# Patient Record
Sex: Female | Born: 2000 | Race: White | Hispanic: No | Marital: Single | Smoking: Former smoker
Health system: Southern US, Community
[De-identification: ages and names within clinical notes are randomized; demographics above are authoritative.]

## PROBLEM LIST (undated history)

## (undated) DIAGNOSIS — O139 Gestational [pregnancy-induced] hypertension without significant proteinuria, unspecified trimester: Secondary | ICD-10-CM

## (undated) DIAGNOSIS — Z789 Other specified health status: Secondary | ICD-10-CM

## (undated) HISTORY — PX: NO PAST SURGERIES: SHX2092

## (undated) HISTORY — DX: Other specified health status: Z78.9

## (undated) HISTORY — DX: Gestational (pregnancy-induced) hypertension without significant proteinuria, unspecified trimester: O13.9

---
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
## 2020-01-11 ENCOUNTER — Encounter: Payer: Self-pay | Admitting: Orthopaedic Surgery

## 2020-01-11 ENCOUNTER — Ambulatory Visit: Payer: Medicaid Other

## 2020-01-11 ENCOUNTER — Ambulatory Visit (INDEPENDENT_AMBULATORY_CARE_PROVIDER_SITE_OTHER): Payer: Medicaid Other | Admitting: Orthopaedic Surgery

## 2020-01-11 ENCOUNTER — Other Ambulatory Visit: Payer: Self-pay

## 2020-01-11 DIAGNOSIS — S62336D Displaced fracture of neck of fifth metacarpal bone, right hand, subsequent encounter for fracture with routine healing: Secondary | ICD-10-CM

## 2020-01-11 DIAGNOSIS — F17210 Nicotine dependence, cigarettes, uncomplicated: Secondary | ICD-10-CM

## 2020-01-11 DIAGNOSIS — S62316D Displaced fracture of base of fifth metacarpal bone, right hand, subsequent encounter for fracture with routine healing: Secondary | ICD-10-CM

## 2020-01-11 MED ORDER — HYDROCODONE-ACETAMINOPHEN 5-325 MG PO TABS: ORAL_TABLET | ORAL | 0 refills | Status: DC

## 2020-01-11 NOTE — Patient Instructions (Signed)
Steps to Quit Smoking Smoking tobacco is the leading cause of preventable death. It can affect almost every organ in the body. Smoking puts you and people around you at risk for many serious, long-lasting (chronic) diseases. Quitting smoking can be hard, but it is one of the best things that you can do for your health. It is never too late to quit. How do I get ready to quit? When you decide to quit smoking, make a plan to help you succeed. Before you quit:  Pick a date to quit. Set a date within the next 2 weeks to give you time to prepare.  Write down the reasons why you are quitting. Keep this list in places where you will see it often.  Tell your family, friends, and co-workers that you are quitting. Their support is important.  Talk with your doctor about the choices that may help you quit.  Find out if your health insurance will pay for these treatments.  Know the people, places, things, and activities that make you want to smoke (triggers). Avoid them. What first steps can I take to quit smoking?  Throw away all cigarettes at home, at work, and in your car.  Throw away the things that you use when you smoke, such as ashtrays and lighters.  Clean your car. Make sure to empty the ashtray.  Clean your home, including curtains and carpets. What can I do to help me quit smoking? Talk with your doctor about taking medicines and seeing a counselor at the same time. You are more likely to succeed when you do both.  If you are pregnant or breastfeeding, talk with your doctor about counseling or other ways to quit smoking. Do not take medicine to help you quit smoking unless your doctor tells you to do so. To quit smoking: Quit right away  Quit smoking totally, instead of slowly cutting back on how much you smoke over a period of time.  Go to counseling. You are more likely to quit if you go to counseling sessions regularly. Take medicine You may take medicines to help you quit. Some  medicines need a prescription, and some you can buy over-the-counter. Some medicines may contain a drug called nicotine to replace the nicotine in cigarettes. Medicines may:  Help you to stop having the desire to smoke (cravings).  Help to stop the problems that come when you stop smoking (withdrawal symptoms). Your doctor may ask you to use:  Nicotine patches, gum, or lozenges.  Nicotine inhalers or sprays.  Non-nicotine medicine that is taken by mouth. Find resources Find resources and other ways to help you quit smoking and remain smoke-free after you quit. These resources are most helpful when you use them often. They include:  Online chats with a counselor.  Phone quitlines.  Printed self-help materials.  Support groups or group counseling.  Text messaging programs.  Mobile phone apps. Use apps on your mobile phone or tablet that can help you stick to your quit plan. There are many free apps for mobile phones and tablets as well as websites. Examples include Quit Guide from the CDC and smokefree.gov  What things can I do to make it easier to quit?   Talk to your family and friends. Ask them to support and encourage you.  Call a phone quitline (1-800-QUIT-NOW), reach out to support groups, or work with a counselor.  Ask people who smoke to not smoke around you.  Avoid places that make you want to smoke,   such as: ? Bars. ? Parties. ? Smoke-break areas at work.  Spend time with people who do not smoke.  Lower the stress in your life. Stress can make you want to smoke. Try these things to help your stress: ? Getting regular exercise. ? Doing deep-breathing exercises. ? Doing yoga. ? Meditating. ? Doing a body scan. To do this, close your eyes, focus on one area of your body at a time from head to toe. Notice which parts of your body are tense. Try to relax the muscles in those areas. How will I feel when I quit smoking? Day 1 to 3 weeks Within the first 24 hours,  you may start to have some problems that come from quitting tobacco. These problems are very bad 2-3 days after you quit, but they do not often last for more than 2-3 weeks. You may get these symptoms:  Mood swings.  Feeling restless, nervous, angry, or annoyed.  Trouble concentrating.  Dizziness.  Strong desire for high-sugar foods and nicotine.  Weight gain.  Trouble pooping (constipation).  Feeling like you may vomit (nausea).  Coughing or a sore throat.  Changes in how the medicines that you take for other issues work in your body.  Depression.  Trouble sleeping (insomnia). Week 3 and afterward After the first 2-3 weeks of quitting, you may start to notice more positive results, such as:  Better sense of smell and taste.  Less coughing and sore throat.  Slower heart rate.  Lower blood pressure.  Clearer skin.  Better breathing.  Fewer sick days. Quitting smoking can be hard. Do not give up if you fail the first time. Some people need to try a few times before they succeed. Do your best to stick to your quit plan, and talk with your doctor if you have any questions or concerns. Summary  Smoking tobacco is the leading cause of preventable death. Quitting smoking can be hard, but it is one of the best things that you can do for your health.  When you decide to quit smoking, make a plan to help you succeed.  Quit smoking right away, not slowly over a period of time.  When you start quitting, seek help from your doctor, family, or friends. This information is not intended to replace advice given to you by your health care provider. Make sure you discuss any questions you have with your health care provider. Document Revised: 06/30/2019 Document Reviewed: 12/24/2018 Elsevier Patient Education  2020 Elsevier Inc.  

## 2020-01-11 NOTE — Progress Notes (Signed)
Patient XB:LTJQZES Laura Mayo, female DOB:[birth year], 19 y.o. PQZ:[id number]  Chief Complaint  Patient presents with  . Hand Injury    right hand fracture     HPI  Laura Mayo is a 19 y.o. female who has fracture of the right fifth metacarpal.  She had x-rays today which showed displacement of the fracture.  I told her and showed her the x-rays.  I needed to do a closed reduction.  Procedure note: After permission from the patient and prep of the right lateral hand, I injected 1% xylocaine as a hematoma block at the right fifth metacarpal head by sterile technique tolerated well.  Closed reduction was then done.  A new splint applied.  New X-rays were done and reported separately.  The results were marked improvement and reduction of the fifth metacarpal fracture at the neck.   There is no height or weight on file to calculate BMI.  ROS  Review of Systems  Constitutional: Positive for activity change.  Musculoskeletal: Positive for arthralgias and joint swelling.  All other systems reviewed and are negative.   All other systems reviewed and are negative.  The following is a summary of the past history medically, past history surgically, known current medicines, social history and family history.  This information is gathered electronically by the computer from prior information and documentation.  I review this each visit and have found including this information at this point in the chart is beneficial and informative.    History reviewed. No pertinent past medical history.  History reviewed. No pertinent surgical history.  Family History  Problem Relation Age of Onset  . Cancer Paternal Grandmother        breast  . Cancer Maternal Grandmother        colon  . Hypertension Maternal Grandfather     Social History Social History   Tobacco Use  . Smoking status: Current Every Day Smoker    Packs/day: 0.50    Types: Cigarettes  . Smokeless tobacco: Never  Used  Substance Use Topics  . Alcohol use: No  . Drug use: No    No Known Allergies  Current Outpatient Medications  Medication Sig Dispense Refill  . oxyCODONE-acetaminophen (PERCOCET/ROXICET) 5-325 MG tablet Take 2 tablets by mouth every 4 (four) hours as needed for severe pain. 6 tablet 0  . HYDROcodone-acetaminophen (NORCO/VICODIN) 5-325 MG tablet One tablet every six hours for pain.  Limit 7 days. 28 tablet 0   No current facility-administered medications for this visit.     Physical Exam  There were no vitals taken for this visit.  Constitutional: overall normal hygiene, normal nutrition, well developed, normal grooming, normal body habitus. Assistive device:right hand splint  Musculoskeletal: gait and station Limp none, muscle tone and strength are normal, no tremors or atrophy is present.  .  Neurological: coordination overall normal.  Deep tendon reflex/nerve stretch intact.  Sensation normal.  Cranial nerves II-XII intact.   Skin:   Normal overall no scars, lesions, ulcers or rashes. No psoriasis.  Psychiatric: Alert and oriented x 3.  Recent memory intact, remote memory unclear.  Normal mood and affect. Well groomed.  Good eye contact.  Cardiovascular: overall no swelling, no varicosities, no edema bilaterally, normal temperatures of the legs and arms, no clubbing, cyanosis and good capillary refill.  Lymphatic: palpation is normal.  She has rotary deformity of the right little finger prior to reduction.  NV intact.  All other systems reviewed and are negative   The patient  has been educated about the nature of the problem(s) and counseled on treatment options.  The patient appeared to understand what I have discussed and is in agreement with it.  Encounter Diagnoses  Name Primary?  . Closed displaced fracture of neck of fifth metacarpal bone of right hand with routine healing, subsequent encounter Yes  . Nicotine dependence, cigarettes, uncomplicated      PLAN Call if any problems.  Precautions discussed.  Continue current medications.   Return to clinic 2 weeks   X-rays then out of splint right hand.  I have reviewed the [place] web site prior to prescribing narcotic medicine for this patient.   Electronically Signed Sanjuana Kava, MD 3/25/202110:52 AM

## 2020-01-25 ENCOUNTER — Ambulatory Visit: Payer: Medicaid Other | Admitting: Orthopaedic Surgery

## 2020-01-25 ENCOUNTER — Encounter: Payer: Self-pay | Admitting: Orthopaedic Surgery

## 2020-05-01 ENCOUNTER — Encounter: Payer: Self-pay | Admitting: Adult Health

## 2020-05-01 ENCOUNTER — Ambulatory Visit (INDEPENDENT_AMBULATORY_CARE_PROVIDER_SITE_OTHER): Payer: Medicaid Other | Admitting: Adult Health

## 2020-05-01 ENCOUNTER — Other Ambulatory Visit: Payer: Self-pay

## 2020-05-01 VITALS — BP 136/88 | HR 107 | Ht 65.0 in | Wt 139.0 lb

## 2020-05-01 DIAGNOSIS — Z3046 Encounter for surveillance of implantable subdermal contraceptive: Secondary | ICD-10-CM | POA: Diagnosis not present

## 2020-05-01 NOTE — Patient Instructions (Signed)
Use condoms x 2 weeks, keep clean and dry x 24 hours, no heavy lifting, keep steri strips on x 72 hours, Keep pressure dressing on x 24 hours. Follow up prn problems.  

## 2020-05-01 NOTE — Progress Notes (Signed)
°  Subjective:     Patient ID: Laura Mayo, female   DOB: 09/04/2001, 19 y.o.   MRN: [id number]  HPI Laura Mayo is a 19 year old white female,single, G0P0, in for nexplanon removal, wants to get pregnant. PCP is Dr Phillips Odor.  Review of Systems For nexplanon removal Reviewed past medical,surgical, social and family history. Reviewed medications and allergies.     Objective:   Physical Exam BP 136/88 (BP Location: Left Arm, Patient Position: Sitting, Cuff Size: Normal)    Pulse (!) 107    Ht 5\' 5"  (1.651 m)    Wt 139 lb (63 kg)    BMI 23.13 kg/m fall risk is low Consent signed, time out called. Left arm cleansed with betadine, and injected with 1.5 cc 2% lidocaine and waited til numb.Under sterile technique a #11 blade was used to make small vertical incision, and a curved forceps was used to easily remove rod. Steri strips applied. Pressure dressing applied.    Assessment:     1. Nexplanon removal Take OTC PNV    Stop any smoking   Plan:     Use condoms x 2 weeks, keep clean and dry x 24 hours, no heavy lifting, keep steri strips on x 72 hours, Keep pressure dressing on x 24 hours. Follow up prn problems.

## 2020-06-26 ENCOUNTER — Ambulatory Visit (INDEPENDENT_AMBULATORY_CARE_PROVIDER_SITE_OTHER): Payer: Medicaid Other | Admitting: *Deleted

## 2020-06-26 VITALS — BP 130/87 | HR 115 | Ht 65.0 in | Wt 143.0 lb

## 2020-06-26 DIAGNOSIS — Z3201 Encounter for pregnancy test, result positive: Secondary | ICD-10-CM | POA: Diagnosis not present

## 2020-06-26 LAB — POCT URINE PREGNANCY: Preg Test, Ur: POSITIVE — AB

## 2020-06-26 NOTE — Progress Notes (Signed)
   NURSE VISIT- PREGNANCY CONFIRMATION   SUBJECTIVE:  Laura Mayo is a 19 y.o. G16P0000 female at [redacted]w[redacted]d by certain LMP of Patient's last menstrual period was 05/29/2020 (approximate). Here for pregnancy confirmation.  Home pregnancy test: positive x 5  She reports no complaints.  She is taking prenatal vitamins.    OBJECTIVE:  BP 130/87 (BP Location: Left Arm, Patient Position: Sitting, Cuff Size: Normal)   Pulse (!) 115   Ht 5\' 5"  (1.651 m)   Wt 143 lb (64.9 kg)   LMP 05/29/2020 (Approximate)   BMI 23.80 kg/m   Appears well, in no apparent distress OB History  Gravida Para Term Preterm AB Living  1 0 0 0 0 0  SAB TAB Ectopic Multiple Live Births  0 0 0 0 0    # Outcome Date GA Lbr Len/2nd Weight Sex Delivery Anes PTL Lv  1 Current             Results for orders placed or performed in visit on 06/26/20 (from the past 24 hour(s))  POCT urine pregnancy   Collection Time: 06/26/20  3:17 PM  Result Value Ref Range   Preg Test, Ur Positive (A) Negative    ASSESSMENT: Positive pregnancy test, [redacted]w[redacted]d by LMP    PLAN: Schedule for dating ultrasound in 3 weeks Prenatal vitamins: continue   Nausea medicines: not currently needed   OB packet given: Yes  [redacted]w[redacted]d  06/26/2020 3:17 PM

## 2020-06-26 NOTE — Progress Notes (Signed)
Chart reviewed for nurse visit. Agree with plan of care.  Adline Potter, NP 06/26/2020 3:40 PM

## 2020-07-12 ENCOUNTER — Other Ambulatory Visit: Payer: Self-pay | Admitting: Obstetrics & Gynecology

## 2020-07-12 DIAGNOSIS — O3680X0 Pregnancy with inconclusive fetal viability, not applicable or unspecified: Secondary | ICD-10-CM

## 2020-07-23 ENCOUNTER — Ambulatory Visit (INDEPENDENT_AMBULATORY_CARE_PROVIDER_SITE_OTHER): Payer: Medicaid Other

## 2020-07-23 DIAGNOSIS — Z3A01 Less than 8 weeks gestation of pregnancy: Secondary | ICD-10-CM | POA: Diagnosis not present

## 2020-07-23 DIAGNOSIS — O3680X0 Pregnancy with inconclusive fetal viability, not applicable or unspecified: Secondary | ICD-10-CM

## 2020-07-23 NOTE — Progress Notes (Signed)
[place] 7+6 wks,single IUP in right horn,bicornuate uterus,FHR 160 BPM,normal ovaries,CRL 13.65 mm

## 2020-07-26 ENCOUNTER — Other Ambulatory Visit: Payer: Self-pay

## 2020-07-26 ENCOUNTER — Other Ambulatory Visit: Payer: Medicaid Other

## 2020-07-26 DIAGNOSIS — Z20822 Contact with and (suspected) exposure to covid-19: Secondary | ICD-10-CM

## 2020-07-28 LAB — SARS-COV-2, NAA 2 DAY TAT

## 2020-07-28 LAB — SPECIMEN STATUS REPORT

## 2020-07-28 LAB — NOVEL CORONAVIRUS, NAA: SARS-CoV-2, NAA: NOT DETECTED

## 2020-08-23 ENCOUNTER — Other Ambulatory Visit: Payer: Self-pay | Admitting: Obstetrics & Gynecology

## 2020-08-23 ENCOUNTER — Encounter: Payer: Self-pay | Admitting: Women's Health

## 2020-08-23 DIAGNOSIS — Z3400 Encounter for supervision of normal first pregnancy, unspecified trimester: Secondary | ICD-10-CM | POA: Insufficient documentation

## 2020-08-23 DIAGNOSIS — O0990 Supervision of high risk pregnancy, unspecified, unspecified trimester: Secondary | ICD-10-CM | POA: Insufficient documentation

## 2020-08-23 DIAGNOSIS — Z3682 Encounter for antenatal screening for nuchal translucency: Secondary | ICD-10-CM

## 2020-08-26 ENCOUNTER — Ambulatory Visit (INDEPENDENT_AMBULATORY_CARE_PROVIDER_SITE_OTHER): Payer: Medicaid Other

## 2020-08-26 ENCOUNTER — Encounter: Payer: Self-pay | Admitting: Women's Health

## 2020-08-26 ENCOUNTER — Ambulatory Visit (INDEPENDENT_AMBULATORY_CARE_PROVIDER_SITE_OTHER): Payer: Medicaid Other | Admitting: Women's Health

## 2020-08-26 ENCOUNTER — Ambulatory Visit: Payer: Medicaid Other | Admitting: *Deleted

## 2020-08-26 VITALS — BP 119/81 | HR 101 | Wt 127.5 lb

## 2020-08-26 DIAGNOSIS — Z3682 Encounter for antenatal screening for nuchal translucency: Secondary | ICD-10-CM

## 2020-08-26 DIAGNOSIS — Q513 Bicornate uterus: Secondary | ICD-10-CM

## 2020-08-26 DIAGNOSIS — Z3A12 12 weeks gestation of pregnancy: Secondary | ICD-10-CM

## 2020-08-26 DIAGNOSIS — Z3402 Encounter for supervision of normal first pregnancy, second trimester: Secondary | ICD-10-CM

## 2020-08-26 DIAGNOSIS — Z331 Pregnant state, incidental: Secondary | ICD-10-CM

## 2020-08-26 DIAGNOSIS — Z3401 Encounter for supervision of normal first pregnancy, first trimester: Secondary | ICD-10-CM

## 2020-08-26 DIAGNOSIS — Z1389 Encounter for screening for other disorder: Secondary | ICD-10-CM

## 2020-08-26 LAB — POCT URINALYSIS DIPSTICK OB
Blood, UA: NEGATIVE
Glucose, UA: NEGATIVE
Ketones, UA: NEGATIVE
Leukocytes, UA: NEGATIVE
Nitrite, UA: NEGATIVE
POC,PROTEIN,UA: NEGATIVE

## 2020-08-26 MED ORDER — BLOOD PRESSURE MONITOR MISC: 0 refills | Status: DC

## 2020-08-26 NOTE — Progress Notes (Signed)
INITIAL OBSTETRICAL VISIT Patient name: Laura Mayo MRN [id number]  Date of birth: 19-Jul-2001 Chief Complaint:   Initial Prenatal Visit  History of Present Illness:   Laura Mayo is a 19 y.o. G70P0000 Caucasian female at [redacted]w[redacted]d by LMP c/w u/s at 7 weeks with an Estimated Date of Delivery: 03/05/21 being seen today for her initial obstetrical visit.   Her obstetrical history is significant for primigravida.   Today she reports lots of n/v in beginning, has lost almost 20lbs. N/V much better now, able to eat w/o problems.  Depression screen [facility] 2/9 08/26/2020  Decreased Interest 0  Down, Depressed, Hopeless 0  PHQ - 2 Score 0  Altered sleeping 0  Tired, decreased energy 0  Change in appetite 0  Feeling bad or failure about yourself  0  Trouble concentrating 0  Moving slowly or fidgety/restless 0  Suicidal thoughts 0  PHQ-9 Score 0    Patient's last menstrual period was 05/29/2020 (approximate). Last pap <21yo. Results were: n/a Review of Systems:   Pertinent items are noted in HPI Denies cramping/contractions, leakage of fluid, vaginal bleeding, abnormal vaginal discharge w/ itching/odor/irritation, headaches, visual changes, shortness of breath, chest pain, abdominal pain, severe nausea/vomiting, or problems with urination or bowel movements unless otherwise stated above.  Pertinent History Reviewed:  Reviewed past medical,surgical, social, obstetrical and family history.  Reviewed problem list, medications and allergies. OB History  Gravida Para Term Preterm AB Living  1 0 0 0 0 0  SAB TAB Ectopic Multiple Live Births  0 0 0 0 0    # Outcome Date GA Lbr Len/2nd Weight Sex Delivery Anes PTL Lv  1 Current            Physical Assessment:   Vitals:   08/26/20 1425  BP: 119/81  Pulse: (!) 101  Weight: 127 lb 8 oz (57.8 kg)  Body mass index is 21.22 kg/m.       Physical Examination:  General appearance - well appearing, and in no distress  Mental status - alert,  oriented to person, place, and time  Psych:  She has a normal mood and affect  Skin - warm and dry, normal color, no suspicious lesions noted  Chest - effort normal, all lung fields clear to auscultation bilaterally  Heart - normal rate and regular rhythm  Abdomen - soft, nontender  Extremities:  No swelling or varicosities noted  Thin prep pap is not done   Chaperone: N/A    TODAY'S NT [place] 12+5 wks,measurements c/w dates,CRL 65.14 mm,NB present,NT 1 mm,normal ovaries,bicornuate uterus,fhr 155 bpm  Results for orders placed or performed in visit on 08/26/20 (from the past 24 hour(s))  POC Urinalysis Dipstick OB   Collection Time: 08/26/20  2:37 PM  Result Value Ref Range   Color, UA     Clarity, UA     Glucose, UA Negative Negative   Bilirubin, UA     Ketones, UA neg    Spec Grav, UA     Blood, UA neg    pH, UA     POC,PROTEIN,UA Negative Negative, Trace, Small (1+), Moderate (2+), Large (3+), 4+   Urobilinogen, UA     Nitrite, UA neg    Leukocytes, UA Negative Negative   Appearance     Odor      Assessment & Plan:  1) Low-Risk Pregnancy G1P0000 at 100w5d with an Estimated Date of Delivery: 03/05/21   2) Initial OB visit  3) Bicornuate uterus  Meds:  Meds ordered this encounter  Medications  . DISCONTD: Blood Pressure Monitor MISC    Sig: For regular home bp monitoring during pregnancy    Dispense:  1 each    Refill:  0    Z34.81  . Blood Pressure Monitor MISC    Sig: For regular home bp monitoring during pregnancy    Dispense:  1 each    Refill:  0    Z34.81    Initial labs obtained Continue prenatal vitamins Reviewed n/v relief measures and warning s/s to report Reviewed recommended weight gain based on pre-gravid BMI Encouraged well-balanced diet Genetic & carrier screening discussed: requests Panorama and NT/IT, declines Horizon 14  Ultrasound discussed; fetal survey: requested CCNC completed> form faxed if has or is planning to apply for medicaid The  nature of [facility] [employer] with multiple MDs and other Advanced Practice Providers was explained to patient; also emphasized that fellows, residents, and students are part of our team. Does not have home bp cuff. Rx faxed to [facility]. Check bp weekly, let us know if >140/90.   Follow-up: Return in about 3 weeks (around 09/16/2020) for LROB, 2nd IT, in person, CNM.   Orders Placed This Encounter  Procedures  . Urine Culture  . GC/Chlamydia Probe Amp  . Integrated 1  . Pain Management Screening Profile (10S)  . CBC/D/Plt+RPR+Rh+ABO+Rub Ab...  . Hgb Fractionation Cascade  . Genetic Screening  . POC Urinalysis Dipstick OB    Cheral Marker CNM, [facility] 08/26/2020 2:55 PM

## 2020-08-26 NOTE — Progress Notes (Signed)
   SUBJECTIVE:  Laura Mayo is a 19 y.o. G57P0000 female here for Panorama NIPT . She is [redacted]w[redacted]d pregnant.   OBJECTIVE:  Appears well, in no apparent distress  Blood work drawn from right [facility] without difficulty. 1 attempt(s).   ASSESSMENT: Pregnancy [redacted]w[redacted]d Panorama NIPT  PLAN: [facility] portal information given and instructed patient how to access results  Jobe Marker  08/26/2020 2:39 PM

## 2020-08-26 NOTE — Progress Notes (Signed)
[place] 12+5 wks,measurements c/w dates,CRL 65.14 mm,NB present,NT 1 mm,normal ovaries,bicornuate uterus,fhr 155 bpm

## 2020-08-26 NOTE — Patient Instructions (Signed)
Laura Mayo, I greatly value your feedback.  If you receive a survey following your visit with [place] today, we appreciate you taking the time to fill it out.  Thanks, Joellyn Haff, CNM, WHNP-BC   [facility] [place] ([address] [place], [place] [address]) Entrance C, located off of [address] 24/7 valet parking   Bicornuate uterus  Nausea & Vomiting  Have saltine crackers or pretzels by your bed and eat a few bites before you raise your head out of bed in the morning  Eat small frequent meals throughout the day instead of large meals  Drink plenty of fluids throughout the day to stay hydrated, just don't drink a lot of fluids with your meals.  This can make your stomach fill up faster making you feel sick  Do not brush your teeth right after you eat  Products with real ginger are good for nausea, like ginger ale and ginger hard candy Make sure it says made with real ginger!  Sucking on sour candy like lemon heads is also good for nausea  If your prenatal vitamins make you nauseated, take them at night so you will sleep through the nausea  Sea Bands  If you feel like you need medicine for the nausea & vomiting please let us know  If you are unable to keep any fluids or food down please let us know   Constipation  Drink plenty of fluid, preferably water, throughout the day  Eat foods high in fiber such as fruits, vegetables, and grains  Exercise, such as walking, is a good way to keep your bowels regular  Drink warm fluids, especially warm prune juice, or decaf coffee  Eat a 1/2 cup of real oatmeal (not instant), 1/2 cup applesauce, and 1/2-1 cup warm prune juice every day  If needed, you may take Colace (docusate sodium) stool softener once or twice a day to help keep the stool soft.   If you still are having problems with constipation, you may take Miralax once daily as needed to help keep your bowels regular.   Home Blood Pressure  Monitoring for Patients   Your provider has recommended that you check your blood pressure (BP) at least once a week at home. If you do not have a blood pressure cuff at home, one will be provided for you. Contact your provider if you have not received your monitor within 1 week.   Helpful Tips for Accurate Home Blood Pressure Checks  . Don't smoke, exercise, or drink caffeine 30 minutes before checking your BP . Use the restroom before checking your BP (a full bladder can raise your pressure) . Relax in a comfortable upright chair . Feet on the ground . Left arm resting comfortably on a flat surface at the level of your heart . Legs uncrossed . Back supported . Sit quietly and don't talk . Place the cuff on your bare arm . Adjust snuggly, so that only two fingertips can fit between your skin and the top of the cuff . Check 2 readings separated by at least one minute . Keep a log of your BP readings . For a visual, please reference this diagram: http://ccnc.care/bpdiagram  Provider Name: [facility]     Phone: [phone]  Zone 1: ALL CLEAR  Continue to monitor your symptoms:  . BP reading is less than 140 (top number) or less than 90 (bottom number)  . No right upper stomach pain .  No headaches or seeing spots . No feeling nauseated or throwing up . No swelling in face and hands  Zone 2: CAUTION Call your doctor's office for any of the following:  . BP reading is greater than 140 (top number) or greater than 90 (bottom number)  . Stomach pain under your ribs in the middle or right side . Headaches or seeing spots . Feeling nauseated or throwing up . Swelling in face and hands  Zone 3: EMERGENCY  Seek immediate medical care if you have any of the following:  . BP reading is greater than160 (top number) or greater than 110 (bottom number) . Severe headaches not improving with Tylenol . Serious difficulty catching your breath . Any worsening symptoms from Zone 2     First Trimester of Pregnancy The first trimester of pregnancy is from week 1 until the end of week 12 (months 1 through 3). A week after a sperm fertilizes an egg, the egg will implant on the wall of the uterus. This embryo will begin to develop into a baby. Genes from you and your partner are forming the baby. The female genes determine whether the baby is a boy or a girl. At 6-8 weeks, the eyes and face are formed, and the heartbeat can be seen on ultrasound. At the end of 12 weeks, all the baby's organs are formed.  Now that you are pregnant, you will want to do everything you can to have a healthy baby. Two of the most important things are to get good prenatal care and to follow your health care provider's instructions. Prenatal care is all the medical care you receive before the baby's birth. This care will help prevent, find, and treat any problems during the pregnancy and childbirth. BODY CHANGES Your body goes through many changes during pregnancy. The changes vary from woman to woman.   You may gain or lose a couple of pounds at first.  You may feel sick to your stomach (nauseous) and throw up (vomit). If the vomiting is uncontrollable, call your health care provider.  You may tire easily.  You may develop headaches that can be relieved by medicines approved by your health care provider.  You may urinate more often. Painful urination may mean you have a bladder infection.  You may develop heartburn as a result of your pregnancy.  You may develop constipation because certain hormones are causing the muscles that push waste through your intestines to slow down.  You may develop hemorrhoids or swollen, bulging veins (varicose veins).  Your breasts may begin to grow larger and become tender. Your nipples may stick out more, and the tissue that surrounds them (areola) may become darker.  Your gums may bleed and may be sensitive to brushing and flossing.  Dark spots or blotches (chloasma,  mask of pregnancy) may develop on your face. This will likely fade after the baby is born.  Your menstrual periods will stop.  You may have a loss of appetite.  You may develop cravings for certain kinds of food.  You may have changes in your emotions from day to day, such as being excited to be pregnant or being concerned that something may go wrong with the pregnancy and baby.  You may have more vivid and strange dreams.  You may have changes in your hair. These can include thickening of your hair, rapid growth, and changes in texture. Some women also have hair loss during or after pregnancy, or hair that feels dry  or thin. Your hair will most likely return to normal after your baby is born. WHAT TO EXPECT AT YOUR PRENATAL VISITS During a routine prenatal visit:  You will be weighed to make sure you and the baby are growing normally.  Your blood pressure will be taken.  Your abdomen will be measured to track your baby's growth.  The fetal heartbeat will be listened to starting around week 10 or 12 of your pregnancy.  Test results from any previous visits will be discussed. Your health care provider may ask you:  How you are feeling.  If you are feeling the baby move.  If you have had any abnormal symptoms, such as leaking fluid, bleeding, severe headaches, or abdominal cramping.  If you have any questions. Other tests that may be performed during your first trimester include:  Blood tests to find your blood type and to check for the presence of any previous infections. They will also be used to check for low iron levels (anemia) and Rh antibodies. Later in the pregnancy, blood tests for diabetes will be done along with other tests if problems develop.  Urine tests to check for infections, diabetes, or protein in the urine.  An ultrasound to confirm the proper growth and development of the baby.  An amniocentesis to check for possible genetic problems.  Fetal screens for  spina bifida and Down syndrome.  You may need other tests to make sure you and the baby are doing well. HOME CARE INSTRUCTIONS  Medicines  Follow your health care provider's instructions regarding medicine use. Specific medicines may be either safe or unsafe to take during pregnancy.  Take your prenatal vitamins as directed.  If you develop constipation, try taking a stool softener if your health care provider approves. Diet  Eat regular, well-balanced meals. Choose a variety of foods, such as meat or vegetable-based protein, fish, milk and low-fat dairy products, vegetables, fruits, and whole grain breads and cereals. Your health care provider will help you determine the amount of weight gain that is right for you.  Avoid raw meat and uncooked cheese. These carry germs that can cause birth defects in the baby.  Eating four or five small meals rather than three large meals a day may help relieve nausea and vomiting. If you start to feel nauseous, eating a few soda crackers can be helpful. Drinking liquids between meals instead of during meals also seems to help nausea and vomiting.  If you develop constipation, eat more high-fiber foods, such as fresh vegetables or fruit and whole grains. Drink enough fluids to keep your urine clear or pale yellow. Activity and Exercise  Exercise only as directed by your health care provider. Exercising will help you:  Control your weight.  Stay in shape.  Be prepared for labor and delivery.  Experiencing pain or cramping in the lower abdomen or low back is a good sign that you should stop exercising. Check with your health care provider before continuing normal exercises.  Try to avoid standing for long periods of time. Move your legs often if you must stand in one place for a long time.  Avoid heavy lifting.  Wear low-heeled shoes, and practice good posture.  You may continue to have sex unless your health care provider directs you  otherwise. Relief of Pain or Discomfort  Wear a good support bra for breast tenderness.    Take warm sitz baths to soothe any pain or discomfort caused by hemorrhoids. Use hemorrhoid cream if your  health care provider approves.    Rest with your legs elevated if you have leg cramps or low back pain.  If you develop varicose veins in your legs, wear support hose. Elevate your feet for 15 minutes, 3-4 times a day. Limit salt in your diet. Prenatal Care  Schedule your prenatal visits by the twelfth week of pregnancy. They are usually scheduled monthly at first, then more often in the last 2 months before delivery.  Write down your questions. Take them to your prenatal visits.  Keep all your prenatal visits as directed by your health care provider. Safety  Wear your seat belt at all times when driving.  Make a list of emergency phone numbers, including numbers for family, friends, the hospital, and police and fire departments. General Tips  Ask your health care provider for a referral to a local prenatal education class. Begin classes no later than at the beginning of month 6 of your pregnancy.  Ask for help if you have counseling or nutritional needs during pregnancy. Your health care provider can offer advice or refer you to specialists for help with various needs.  Do not use hot tubs, steam rooms, or saunas.  Do not douche or use tampons or scented sanitary pads.  Do not cross your legs for long periods of time.  Avoid cat litter boxes and soil used by cats. These carry germs that can cause birth defects in the baby and possibly loss of the fetus by miscarriage or stillbirth.  Avoid all smoking, herbs, alcohol, and medicines not prescribed by your health care provider. Chemicals in these affect the formation and growth of the baby.  Schedule a dentist appointment. At home, brush your teeth with a soft toothbrush and be gentle when you floss. SEEK MEDICAL CARE IF:   You have  dizziness.  You have mild pelvic cramps, pelvic pressure, or nagging pain in the abdominal area.  You have persistent nausea, vomiting, or diarrhea.  You have a bad smelling vaginal discharge.  You have pain with urination.  You notice increased swelling in your face, hands, legs, or ankles. SEEK IMMEDIATE MEDICAL CARE IF:   You have a fever.  You are leaking fluid from your vagina.  You have spotting or bleeding from your vagina.  You have severe abdominal cramping or pain.  You have rapid weight gain or loss.  You vomit blood or material that looks like coffee grounds.  You are exposed to [place] measles and have never had them.  You are exposed to fifth disease or chickenpox.  You develop a severe headache.  You have shortness of breath.  You have any kind of trauma, such as from a fall or a car accident. Document Released: 09/29/2001 Document Revised: 02/19/2014 Document Reviewed: 08/15/2013 [facility] Patient Information 2015 [place], [place]. This information is not intended to replace advice given to you by your health care provider. Make sure you discuss any questions you have with your health care provider.

## 2020-08-27 LAB — CBC/D/PLT+RPR+RH+ABO+RUB AB...
HIV Screen 4th Generation wRfx: NONREACTIVE
Immature Grans (Abs): 0 10*3/uL (ref 0.0–0.1)
Lymphs: 18 %
Rh Factor: POSITIVE
WBC: 7.8 10*3/uL (ref 3.4–10.8)

## 2020-08-27 LAB — INTEGRATED 1

## 2020-08-28 LAB — CBC/D/PLT+RPR+RH+ABO+RUB AB...
Antibody Screen: NEGATIVE
Basophils Absolute: 0 10*3/uL (ref 0.0–0.2)
Basos: 1 %
EOS (ABSOLUTE): 0.1 10*3/uL (ref 0.0–0.4)
Eos: 1 %
HCV Ab: <0.1 s/co ratio (ref 0.0–0.9)
Hematocrit: 35.6 % (ref 34.0–46.6)
Hemoglobin: 11.9 g/dL (ref 11.1–15.9)
Hepatitis B Surface Ag: NEGATIVE
Immature Granulocytes: 0 %
Lymphocytes Absolute: 1.4 10*3/uL (ref 0.7–3.1)
MCH: 30.7 pg (ref 26.6–33.0)
MCHC: 33.4 g/dL (ref 31.5–35.7)
MCV: 92 fL (ref 79–97)
Monocytes Absolute: 0.5 10*3/uL (ref 0.1–0.9)
Monocytes: 6 %
Neutrophils Absolute: 5.8 10*3/uL (ref 1.4–7.0)
Neutrophils: 74 %
Platelets: 248 10*3/uL (ref 150–450)
RBC: 3.87 x10E6/uL (ref 3.77–5.28)
RDW: 11.9 % (ref 11.7–15.4)
RPR Ser Ql: NONREACTIVE
Rubella Antibodies, IGG: 2.28 index (ref >0.99)

## 2020-08-28 LAB — HGB FRACTIONATION CASCADE
Hgb A2: 2.8 % (ref 1.8–3.2)
Hgb A: 97.2 % (ref 96.4–98.8)
Hgb F: 0 % (ref 0.0–2.0)
Hgb S: 0 %

## 2020-08-28 LAB — GC/CHLAMYDIA PROBE AMP
Chlamydia trachomatis, NAA: NEGATIVE
Neisseria Gonorrhoeae by PCR: NEGATIVE

## 2020-08-28 LAB — INTEGRATED 1
Crown Rump Length: 65.1 mm
Gest. Age on Collection Date: 12.7 weeks
Nuchal Translucency (NT): 1 mm
Number of Fetuses: 1
PAPP-A Value: 327 ng/mL
Weight: 128 [lb_av]

## 2020-08-28 LAB — HCV INTERPRETATION

## 2020-08-28 LAB — URINE CULTURE

## 2020-08-29 LAB — PMP SCREEN PROFILE (10S), URINE
Amphetamine Scrn, Ur: NEGATIVE ng/mL
BARBITURATE SCREEN URINE: NEGATIVE ng/mL
BENZODIAZEPINE SCREEN, URINE: NEGATIVE ng/mL
CANNABINOIDS UR QL SCN: NEGATIVE ng/mL
Cocaine (Metab) Scrn, Ur: NEGATIVE ng/mL
Creatinine(Crt), U: 97.5 mg/dL (ref 20.0–300.0)
Methadone Screen, Urine: NEGATIVE ng/mL
OXYCODONE+OXYMORPHONE UR QL SCN: NEGATIVE ng/mL
Opiate Scrn, Ur: NEGATIVE ng/mL
Ph of Urine: 6.7 (ref 4.5–8.9)
Phencyclidine Qn, Ur: NEGATIVE ng/mL
Propoxyphene Scrn, Ur: NEGATIVE ng/mL

## 2020-09-16 ENCOUNTER — Ambulatory Visit (INDEPENDENT_AMBULATORY_CARE_PROVIDER_SITE_OTHER): Payer: Medicaid Other | Admitting: Obstetrics & Gynecology

## 2020-09-16 ENCOUNTER — Other Ambulatory Visit: Payer: Self-pay

## 2020-09-16 ENCOUNTER — Encounter: Payer: Self-pay | Admitting: Obstetrics & Gynecology

## 2020-09-16 VITALS — BP 130/86 | HR 108

## 2020-09-16 DIAGNOSIS — Z3A15 15 weeks gestation of pregnancy: Secondary | ICD-10-CM

## 2020-09-16 DIAGNOSIS — Z331 Pregnant state, incidental: Secondary | ICD-10-CM

## 2020-09-16 DIAGNOSIS — Z1389 Encounter for screening for other disorder: Secondary | ICD-10-CM

## 2020-09-16 LAB — POCT URINALYSIS DIPSTICK OB
Blood, UA: NEGATIVE
Glucose, UA: NEGATIVE
Ketones, UA: NEGATIVE
Leukocytes, UA: NEGATIVE
Nitrite, UA: NEGATIVE
POC,PROTEIN,UA: NEGATIVE

## 2020-09-16 NOTE — Progress Notes (Signed)
   LOW-RISK PREGNANCY VISIT Patient name: Laura Mayo MRN [id number]  Date of birth: 05/18/2001 Chief Complaint:   Routine Prenatal Visit  History of Present Illness:   Laura Mayo is a 19 y.o. G68P0000 female at [redacted]w[redacted]d with an Estimated Date of Delivery: 03/05/21 being seen today for ongoing management of a low-risk pregnancy.  Depression screen [facility] 2/9 08/26/2020  Decreased Interest 0  Down, Depressed, Hopeless 0  PHQ - 2 Score 0  Altered sleeping 0  Tired, decreased energy 0  Change in appetite 0  Feeling bad or failure about yourself  0  Trouble concentrating 0  Moving slowly or fidgety/restless 0  Suicidal thoughts 0  PHQ-9 Score 0    Today she reports no complaints. Contractions: Not present. Vag. Bleeding: None.  Movement: Present. denies leaking of fluid. Review of Systems:   Pertinent items are noted in HPI Denies abnormal vaginal discharge w/ itching/odor/irritation, headaches, visual changes, shortness of breath, chest pain, abdominal pain, severe nausea/vomiting, or problems with urination or bowel movements unless otherwise stated above. Pertinent History Reviewed:  Reviewed past medical,surgical, social, obstetrical and family history.  Reviewed problem list, medications and allergies. Physical Assessment:   Vitals:   09/16/20 1628  BP: 130/86  Pulse: (!) 108  There is no height or weight on file to calculate BMI.        Physical Examination:   General appearance: Well appearing, and in no distress  Mental status: Alert, oriented to person, place, and time  Skin: Warm & dry  Cardiovascular: Normal heart rate noted  Respiratory: Normal respiratory effort, no distress  Abdomen: Soft, gravid, nontender  Pelvic: Cervical exam deferred         Extremities: Edema: None  Fetal Status:     Movement: Present    Chaperone: n/a    Results for orders placed or performed in visit on 09/16/20 (from the past 24 hour(s))  POC Urinalysis Dipstick OB   Collection  Time: 09/16/20  4:30 PM  Result Value Ref Range   Color, UA     Clarity, UA     Glucose, UA Negative Negative   Bilirubin, UA     Ketones, UA neg    Spec Grav, UA     Blood, UA neg    pH, UA     POC,PROTEIN,UA Negative Negative, Trace, Small (1+), Moderate (2+), Large (3+), 4+   Urobilinogen, UA     Nitrite, UA neg    Leukocytes, UA Negative Negative   Appearance     Odor      Assessment & Plan:  1) Low-risk pregnancy G1P0000 at [redacted]w[redacted]d with an Estimated Date of Delivery: 03/05/21   2) Borderline BP    Meds: No orders of the defined types were placed in this encounter.  Labs/procedures today:   Plan:  Continue routine obstetrical care  Next visit: prefers in person    Reviewed: Preterm labor symptoms and general obstetric precautions including but not limited to vaginal bleeding, contractions, leaking of fluid and fetal movement were reviewed in detail with the patient.  All questions were answered. Has home bp cuff. Rx faxed to . Check bp weekly, let us know if >140/90.   Follow-up: Return in about 24 days (around 10/10/2020) for 20 week sono, LROB.  Orders Placed This Encounter  Procedures  . US OB Comp + 14 Wk  . INTEGRATED 2  . POC Urinalysis Dipstick OB    Lazaro Arms, MD 09/16/2020 4:49 PM

## 2020-10-15 ENCOUNTER — Other Ambulatory Visit: Payer: Self-pay | Admitting: Obstetrics & Gynecology

## 2020-10-15 DIAGNOSIS — Z363 Encounter for antenatal screening for malformations: Secondary | ICD-10-CM

## 2020-10-16 ENCOUNTER — Ambulatory Visit (INDEPENDENT_AMBULATORY_CARE_PROVIDER_SITE_OTHER): Payer: Medicaid Other

## 2020-10-16 ENCOUNTER — Ambulatory Visit (INDEPENDENT_AMBULATORY_CARE_PROVIDER_SITE_OTHER): Payer: Medicaid Other | Admitting: Advanced Practice Midwife

## 2020-10-16 ENCOUNTER — Other Ambulatory Visit: Payer: Self-pay

## 2020-10-16 VITALS — BP 106/65 | HR 66 | Wt 128.8 lb

## 2020-10-16 DIAGNOSIS — Z3A20 20 weeks gestation of pregnancy: Secondary | ICD-10-CM

## 2020-10-16 DIAGNOSIS — Z363 Encounter for antenatal screening for malformations: Secondary | ICD-10-CM

## 2020-10-16 DIAGNOSIS — Z331 Pregnant state, incidental: Secondary | ICD-10-CM

## 2020-10-16 DIAGNOSIS — Z3402 Encounter for supervision of normal first pregnancy, second trimester: Secondary | ICD-10-CM

## 2020-10-16 DIAGNOSIS — Z1389 Encounter for screening for other disorder: Secondary | ICD-10-CM

## 2020-10-16 LAB — POCT URINALYSIS DIPSTICK OB
Blood, UA: NEGATIVE
Glucose, UA: NEGATIVE
Ketones, UA: NEGATIVE
Leukocytes, UA: NEGATIVE
Nitrite, UA: NEGATIVE
POC,PROTEIN,UA: NEGATIVE

## 2020-10-16 NOTE — Patient Instructions (Signed)
Laura Mayo, I greatly value your feedback.  If you receive a survey following your visit with [place] today, we appreciate you taking the time to fill it out.  Thanks, Philipp Deputy CNM  [facility] ([address] [place], [place] [address]) Entrance C, located off of [address] valet parking  Go to [email or web address] to register for FREE online childbirth classes  [place] Pediatricians/Family Doctors:  [facility] [phone]            [facility] [phone]                 [facility] [phone] (usually not accepting new patients unless you have family there already, you are always welcome to call and ask)       [facility] [phone]       [facility] Pediatricians/Family Doctors:   [facility]: [phone]  [facility]: [phone]  [facility]: [phone]  [facility] Doctors:   [facility]: [phone]   [facility]: [phone]  [facility] Doctors:  [facility]: [phone]    Home Blood Pressure Monitoring for Patients   Your provider has recommended that you check your blood pressure (BP) at least once a week at home. If you do not have a blood pressure cuff at home, one will be provided for you. Contact your provider if you have not received your monitor within 1 week.   Helpful Tips for Accurate Home Blood Pressure Checks  . Don't smoke, exercise, or drink caffeine 30 minutes before checking your BP . Use the restroom before checking your BP (a full bladder can raise your pressure) . Relax in a comfortable upright chair . Feet on the ground . Left arm resting comfortably on a flat surface at the level of your heart . Legs uncrossed . Back supported . Sit quietly and don't talk . Place the cuff on your bare arm . Adjust snuggly, so that only  two fingertips can fit between your skin and the top of the cuff . Check 2 readings separated by at least one minute . Keep a log of your BP readings . For a visual, please reference this diagram: http://ccnc.care/bpdiagram  Provider Name: [facility]     Phone: [phone]  Zone 1: ALL CLEAR  Continue to monitor your symptoms:  . BP reading is less than 140 (top number) or less than 90 (bottom number)  . No right upper stomach pain . No headaches or seeing spots . No feeling nauseated or throwing up . No swelling in face and hands  Zone 2: CAUTION Call your doctor's office for any of the following:  . BP reading is greater than 140 (top number) or greater than 90 (bottom number)  . Stomach pain under your ribs in the middle or right side . Headaches or seeing spots . Feeling nauseated or throwing up . Swelling in face and hands  Zone 3: EMERGENCY  Seek immediate medical care if you have any of the following:  . BP reading is greater than160 (top number) or greater than 110 (bottom number) . Severe headaches not improving with Tylenol . Serious difficulty catching your breath . Any worsening symptoms from Zone 2     Second Trimester of Pregnancy The second trimester is from week 14 through week 27 (months 4 through 6). The second trimester is often a time when you feel your best.  Your body has adjusted to being pregnant, and you begin to feel better physically. Usually, morning sickness has lessened or quit completely, you may have more energy, and you may have an increase in appetite. The second trimester is also a time when the fetus is growing rapidly. At the end of the sixth month, the fetus is about 9 inches long and weighs about 1 pounds. You will likely begin to feel the baby move (quickening) between 16 and 20 weeks of pregnancy. Body changes during your second trimester Your body continues to go through many changes during your second trimester. The changes vary  from woman to woman.  Your weight will continue to increase. You will notice your lower abdomen bulging out.  You may begin to get stretch marks on your hips, abdomen, and breasts.  You may develop headaches that can be relieved by medicines. The medicines should be approved by your health care provider.  You may urinate more often because the fetus is pressing on your bladder.  You may develop or continue to have heartburn as a result of your pregnancy.  You may develop constipation because certain hormones are causing the muscles that push waste through your intestines to slow down.  You may develop hemorrhoids or swollen, bulging veins (varicose veins).  You may have back pain. This is caused by: ? Weight gain. ? Pregnancy hormones that are relaxing the joints in your pelvis. ? A shift in weight and the muscles that support your balance.  Your breasts will continue to grow and they will continue to become tender.  Your gums may bleed and may be sensitive to brushing and flossing.  Dark spots or blotches (chloasma, mask of pregnancy) may develop on your face. This will likely fade after the baby is born.  A dark line from your belly button to the pubic area (linea nigra) may appear. This will likely fade after the baby is born.  You may have changes in your hair. These can include thickening of your hair, rapid growth, and changes in texture. Some women also have hair loss during or after pregnancy, or hair that feels dry or thin. Your hair will most likely return to normal after your baby is born.  What to expect at prenatal visits During a routine prenatal visit:  You will be weighed to make sure you and the fetus are growing normally.  Your blood pressure will be taken.  Your abdomen will be measured to track your baby's growth.  The fetal heartbeat will be listened to.  Any test results from the previous visit will be discussed.  Your health care provider may ask  you:  How you are feeling.  If you are feeling the baby move.  If you have had any abnormal symptoms, such as leaking fluid, bleeding, severe headaches, or abdominal cramping.  If you are using any tobacco products, including cigarettes, chewing tobacco, and electronic cigarettes.  If you have any questions.  Other tests that may be performed during your second trimester include:  Blood tests that check for: ? Low iron levels (anemia). ? High blood sugar that affects pregnant women (gestational diabetes) between 14 and 28 weeks. ? Rh antibodies. This is to check for a protein on red blood cells (Rh factor).  Urine tests to check for infections, diabetes, or protein in the urine.  An ultrasound to confirm the proper growth and development of the baby.  An amniocentesis to check for possible genetic problems.  Fetal screens  for spina bifida and Down syndrome.  HIV (human immunodeficiency virus) testing. Routine prenatal testing includes screening for HIV, unless you choose not to have this test.  Follow these instructions at home: Medicines  Follow your health care provider's instructions regarding medicine use. Specific medicines may be either safe or unsafe to take during pregnancy.  Take a prenatal vitamin that contains at least 600 micrograms (mcg) of folic acid.  If you develop constipation, try taking a stool softener if your health care provider approves. Eating and drinking  Eat a balanced diet that includes fresh fruits and vegetables, whole grains, good sources of protein such as meat, eggs, or tofu, and low-fat dairy. Your health care provider will help you determine the amount of weight gain that is right for you.  Avoid raw meat and uncooked cheese. These carry germs that can cause birth defects in the baby.  If you have low calcium intake from food, talk to your health care provider about whether you should take a daily calcium supplement.  Limit foods that  are high in fat and processed sugars, such as fried and sweet foods.  To prevent constipation: ? Drink enough fluid to keep your urine clear or pale yellow. ? Eat foods that are high in fiber, such as fresh fruits and vegetables, whole grains, and beans. Activity  Exercise only as directed by your health care provider. Most women can continue their usual exercise routine during pregnancy. Try to exercise for 30 minutes at least 5 days a week. Stop exercising if you experience uterine contractions.  Avoid heavy lifting, wear low heel shoes, and practice good posture.  A sexual relationship may be continued unless your health care provider directs you otherwise. Relieving pain and discomfort  Wear a good support bra to prevent discomfort from breast tenderness.  Take warm sitz baths to soothe any pain or discomfort caused by hemorrhoids. Use hemorrhoid cream if your health care provider approves.  Rest with your legs elevated if you have leg cramps or low back pain.  If you develop varicose veins, wear support hose. Elevate your feet for 15 minutes, 3-4 times a day. Limit salt in your diet. Prenatal Care  Write down your questions. Take them to your prenatal visits.  Keep all your prenatal visits as told by your health care provider. This is important. Safety  Wear your seat belt at all times when driving.  Make a list of emergency phone numbers, including numbers for family, friends, the hospital, and police and fire departments. General instructions  Ask your health care provider for a referral to a local prenatal education class. Begin classes no later than the beginning of month 6 of your pregnancy.  Ask for help if you have counseling or nutritional needs during pregnancy. Your health care provider can offer advice or refer you to specialists for help with various needs.  Do not use hot tubs, steam rooms, or saunas.  Do not douche or use tampons or scented sanitary  pads.  Do not cross your legs for long periods of time.  Avoid cat litter boxes and soil used by cats. These carry germs that can cause birth defects in the baby and possibly loss of the fetus by miscarriage or stillbirth.  Avoid all smoking, herbs, alcohol, and unprescribed drugs. Chemicals in these products can affect the formation and growth of the baby.  Do not use any products that contain nicotine or tobacco, such as cigarettes and e-cigarettes. If you need help quitting,  ask your health care provider.  Visit your dentist if you have not gone yet during your pregnancy. Use a soft toothbrush to brush your teeth and be gentle when you floss. Contact a health care provider if:  You have dizziness.  You have mild pelvic cramps, pelvic pressure, or nagging pain in the abdominal area.  You have persistent nausea, vomiting, or diarrhea.  You have a bad smelling vaginal discharge.  You have pain when you urinate. Get help right away if:  You have a fever.  You are leaking fluid from your vagina.  You have spotting or bleeding from your vagina.  You have severe abdominal cramping or pain.  You have rapid weight gain or weight loss.  You have shortness of breath with chest pain.  You notice sudden or extreme swelling of your face, hands, ankles, feet, or legs.  You have not felt your baby move in over an hour.  You have severe headaches that do not go away when you take medicine.  You have vision changes. Summary  The second trimester is from week 14 through week 27 (months 4 through 6). It is also a time when the fetus is growing rapidly.  Your body goes through many changes during pregnancy. The changes vary from woman to woman.  Avoid all smoking, herbs, alcohol, and unprescribed drugs. These chemicals affect the formation and growth your baby.  Do not use any tobacco products, such as cigarettes, chewing tobacco, and e-cigarettes. If you need help quitting, ask your  health care provider.  Contact your health care provider if you have any questions. Keep all prenatal visits as told by your health care provider. This is important. This information is not intended to replace advice given to you by your health care provider. Make sure you discuss any questions you have with your health care provider. Document Released: 09/29/2001 Document Revised: 03/12/2016 Document Reviewed: 12/06/2012 Elsevier Interactive Patient Education  2017 [employer].

## 2020-10-16 NOTE — Progress Notes (Signed)
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

## 2020-10-16 NOTE — Progress Notes (Signed)
   LOW-RISK PREGNANCY VISIT Patient name: Laura Mayo MRN [id number]  Date of birth: 19-Oct-2001 Chief Complaint:   Routine Prenatal Visit  History of Present Illness:   Laura Mayo is a 19 y.o. G34P0000 female at [redacted]w[redacted]d with an Estimated Date of Delivery: 03/05/21 being seen today for ongoing management of a low-risk pregnancy.  Today she reports doing well; didn't get 2nd IT last time because the lab was closed. Contractions: Not present. Vag. Bleeding: None.  Movement: Present. denies leaking of fluid. Review of Systems:   Pertinent items are noted in HPI Denies abnormal vaginal discharge w/ itching/odor/irritation, headaches, visual changes, shortness of breath, chest pain, abdominal pain, severe nausea/vomiting, or problems with urination or bowel movements unless otherwise stated above. Pertinent History Reviewed:  Reviewed past medical,surgical, social, obstetrical and family history.  Reviewed problem list, medications and allergies. Physical Assessment:   Vitals:   10/16/20 1124  BP: 106/65  Pulse: 66  Weight: 128 lb 12.8 oz (58.4 kg)  Body mass index is 21.43 kg/m.        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,normal right ovary,left ovary not visualized,fhr 146 bpm,svp of fluid 4.2 cm,EFW 328 g 47%,anatomy complete,no obvious abnormalities   Results for orders placed or performed in visit on 10/16/20 (from the past 24 hour(s))  POC Urinalysis Dipstick OB   Collection Time: 10/16/20 11:23 AM  Result Value Ref  Range   Color, UA     Clarity, UA     Glucose, UA Negative Negative   Bilirubin, UA     Ketones, UA neg    Spec Grav, UA     Blood, UA neg    pH, UA     POC,PROTEIN,UA Negative Negative, Trace, Small (1+), Moderate (2+), Large (3+), 4+   Urobilinogen, UA     Nitrite, UA neg    Leukocytes, UA Negative Negative   Appearance     Odor      Assessment & Plan:  1) Low-risk pregnancy G1P0000 at [redacted]w[redacted]d with an Estimated Date of Delivery: 03/05/21     Meds: No orders of the defined types were placed in this encounter.  Labs/procedures today: 2nd IT, anatomy u/s  Plan:  Continue routine obstetrical care   Reviewed: Preterm labor symptoms and general obstetric precautions including but not limited to vaginal bleeding, contractions, leaking of fluid and fetal movement were reviewed in detail with the patient.  All questions were answered. Didn't ask about home bp cuff. Check bp weekly, let us know if >140/90.   Follow-up: Return in about 4 weeks (around 11/13/2020) for LROB, in person.  Orders Placed This Encounter  Procedures  . POC Urinalysis Dipstick OB   Arabella Merles [facility] 10/16/2020 11:49 AM

## 2020-10-19 NOTE — L&D Delivery Note (Signed)
OB[facility]N [facility] Delivery Note  Laura Mayo is a 20 y.o. G1P0000 s/p induced vag del at [redacted]w[redacted]d. She was admitted for IOL due to FGR and gHTN, both dx at ~ 34wks.   ROM: 4h 67m with clear fluid GBS Status: neg Maximum Maternal Temperature: 98.6  Labor Progress: Marland Kitchen Laura Mayo was admitted for IOL due to FGR and gHTN. She had a cervical foley placement with Pitocin started shortly thereafter, and AROM later than evening. She progressed to vag del after pushing x approx 40 mins.   Delivery Date/Time: May 4th, 2022 at 2222 Delivery: Called to room and patient was complete and pushing. Head delivered ROA. No nuchal cord present. Shoulder and body delivered in usual fashion. Infant with spontaneous cry, placed on mother's abdomen, dried and stimulated. Cord clamped x 2 after 1-minute delay, and cut by FOB. Cord blood drawn. Placenta delivered spontaneously with gentle cord traction. Fundus firm with massage and Pitocin. Labia, perineum, vagina, and cervix inspected inspected with and found to have L labial abrasion, hemostatic and not repaired.   Placenta: spont, intact Complications: none Lacerations: none EBL: 100cc Analgesia: epidural  Postpartum Planning [x]  message to sent to schedule follow-up   Infant: boy  APGARs 9/9  2240g (4lb 15oz)  11/9, CNM  02/19/2021 10:49 PM

## 2020-10-22 LAB — INTEGRATED 2
AFP MoM: 1.1
Alpha-Fetoprotein: 65.2 ng/mL
Crown Rump Length: 65.1 mm
DIA MoM: 0.7
DIA Value: 144.2 pg/mL
Estriol, Unconjugated: 2.31 ng/mL
Gest. Age on Collection Date: 12.7 weeks
Gestational Age: 20 weeks
Maternal Age at EDD: 19.7 yr
Nuchal Translucency (NT): 1 mm
Nuchal Translucency MoM: 0.68
Number of Fetuses: 1
PAPP-A MoM: 0.25
PAPP-A Value: 327 ng/mL
Test Results:: NEGATIVE
Weight: 128 [lb_av]
Weight: 128 [lb_av]
hCG MoM: 0.79
hCG Value: 19.3 IU/mL
uE3 MoM: 1

## 2020-10-23 ENCOUNTER — Encounter: Payer: Self-pay | Admitting: *Deleted

## 2020-10-23 DIAGNOSIS — Z3402 Encounter for supervision of normal first pregnancy, second trimester: Secondary | ICD-10-CM

## 2020-10-26 IMAGING — DX DG HAND COMPLETE 3+V*R*
3 series · 3 of 3 positions shown · non-contrast
Comparison: Films from earlier in the same day.

CLINICAL DATA: Status post reduction

EXAM:
RIGHT HAND - COMPLETE 3+ VIEW

[hand ap]
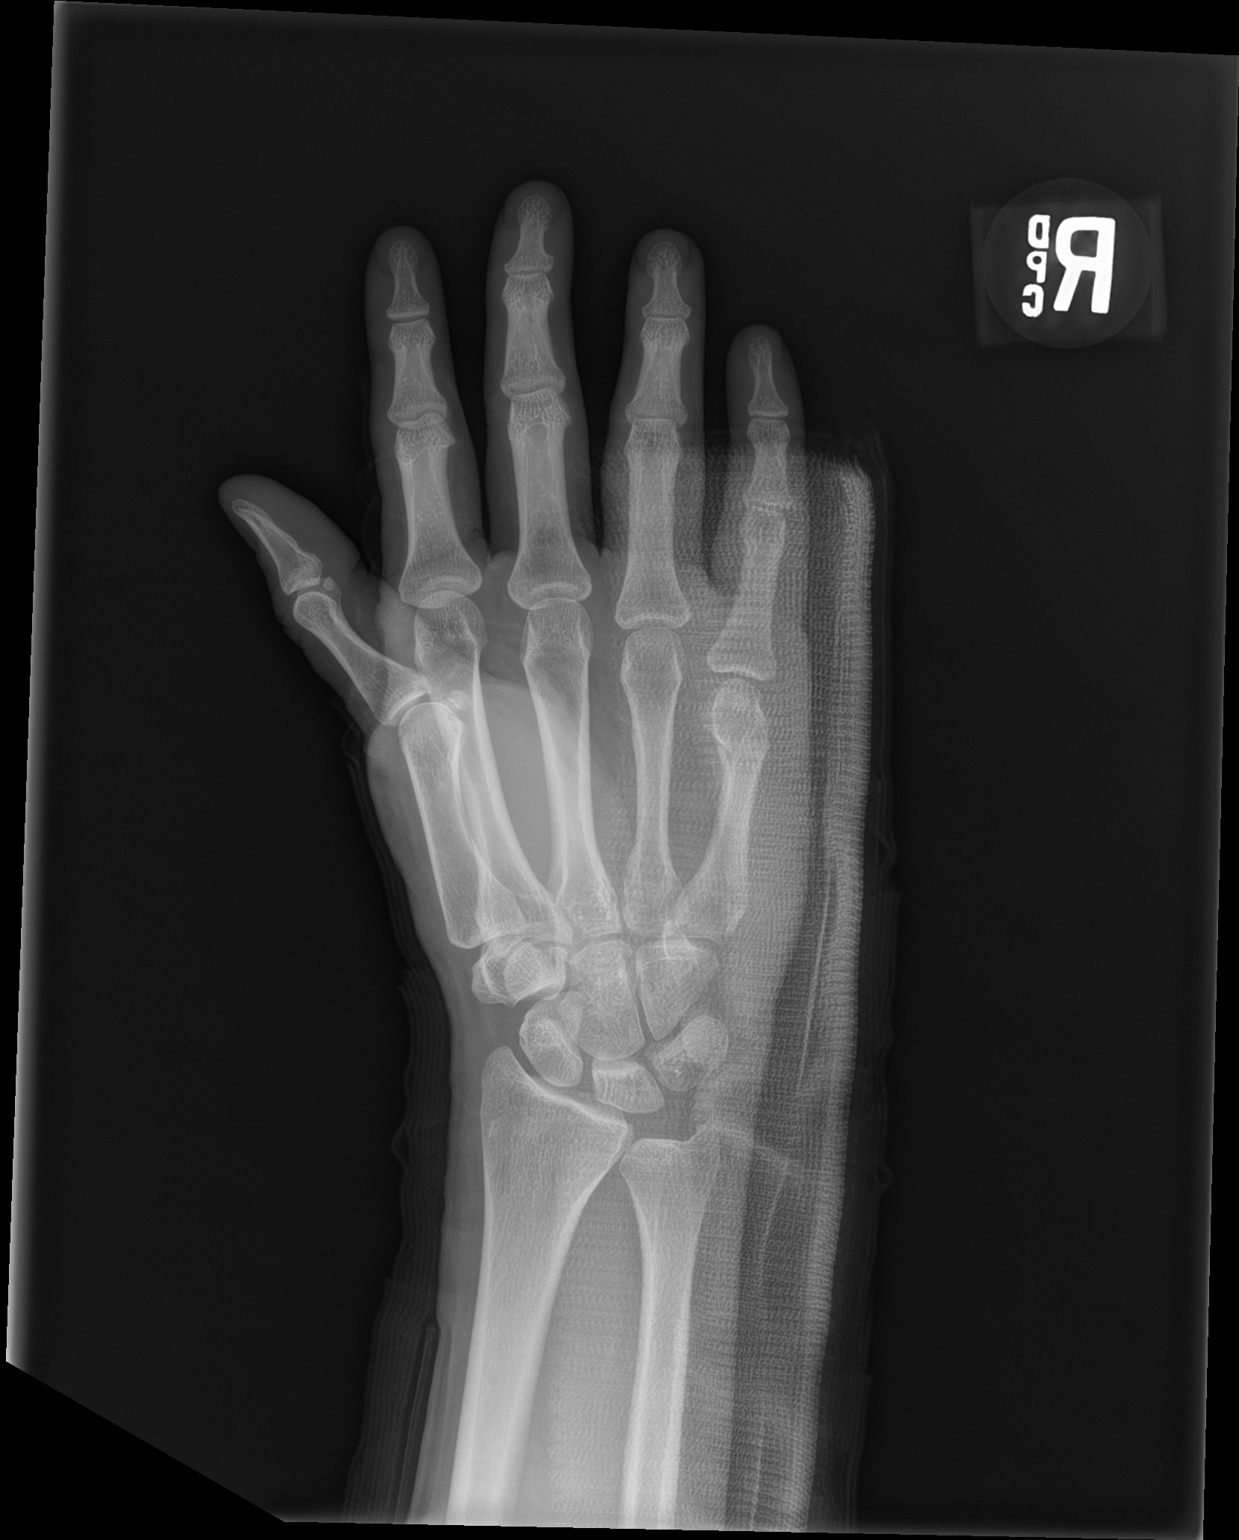

[hand obl]
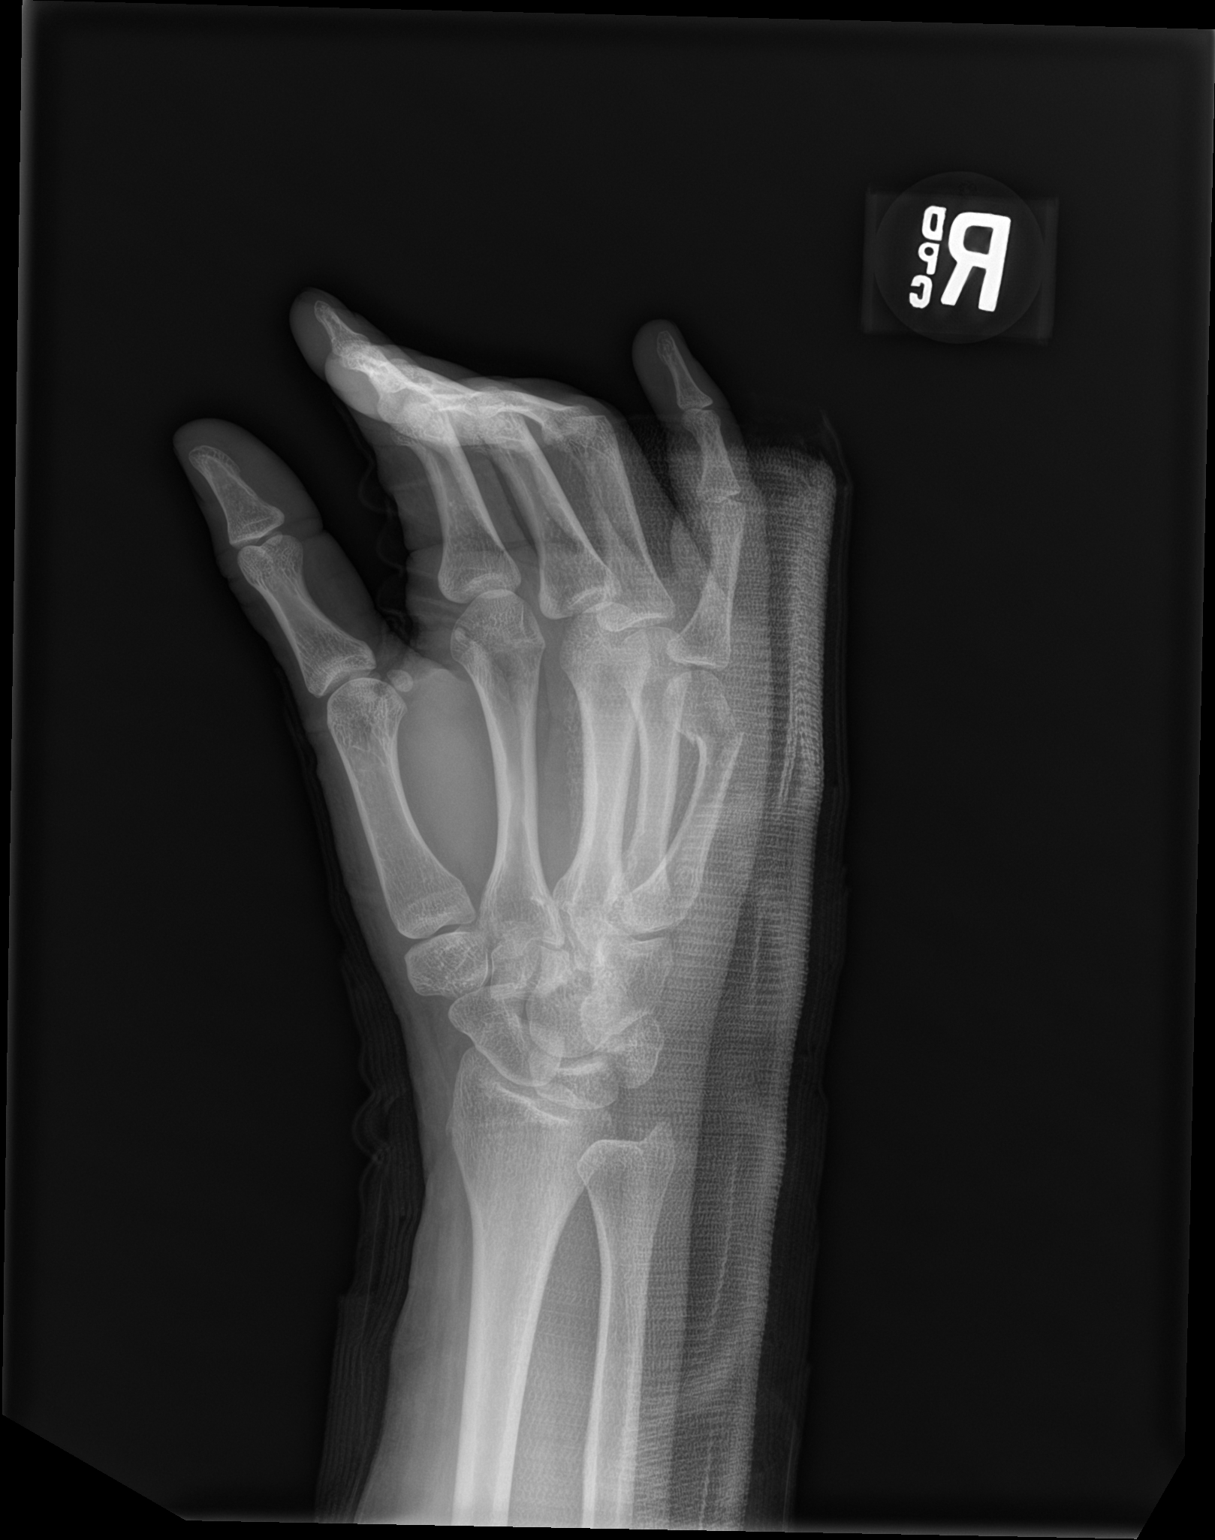

[hand lat]
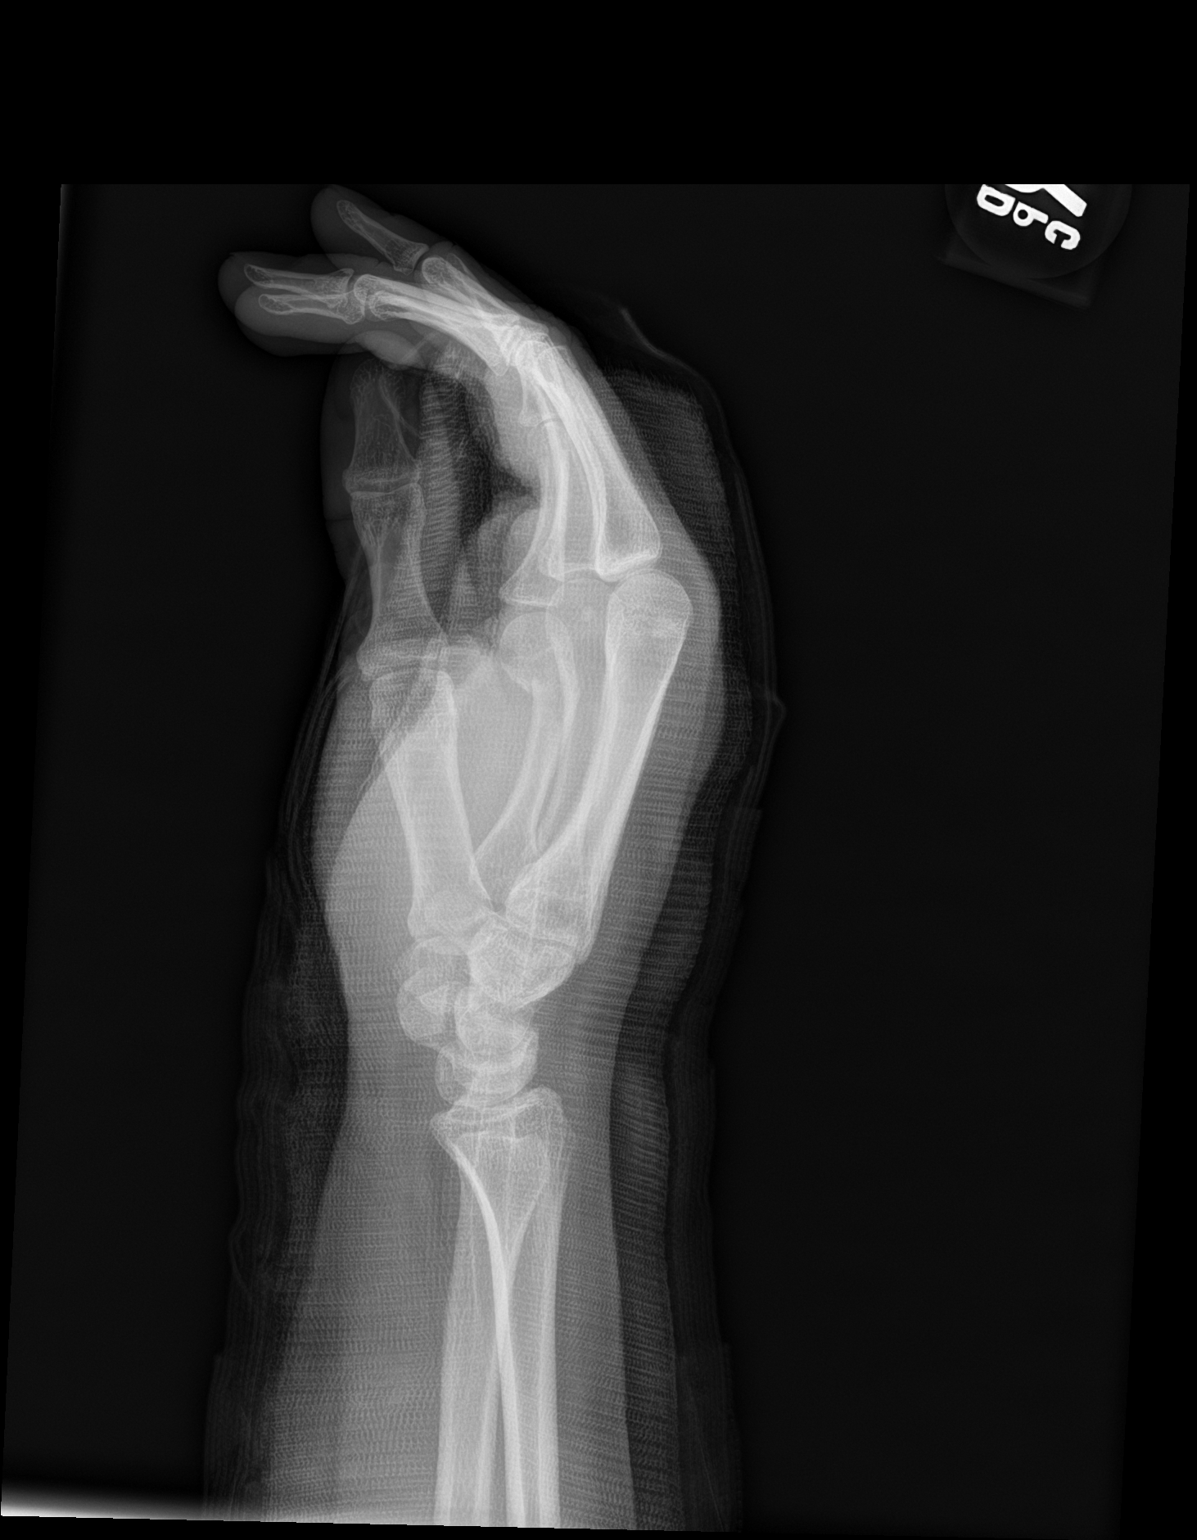

[3 of 3 positions shown; findings below may reference images not displayed]

FINDINGS: There is been interval reduction of the distal fifth metacarpal
fracture with splinting material placed. The degree of angulation
has decreased slightly in the interval from the prior exam. No new
focal abnormality is seen.
IMPRESSION: Slight decrease in the degree of angulation of the fifth metacarpal
fracture.

## 2020-11-12 ENCOUNTER — Encounter: Payer: Self-pay | Admitting: *Deleted

## 2020-11-13 ENCOUNTER — Encounter: Payer: Self-pay | Admitting: Obstetrics and Gynecology

## 2020-11-13 ENCOUNTER — Encounter: Payer: Self-pay | Admitting: *Deleted

## 2020-11-13 ENCOUNTER — Telehealth (INDEPENDENT_AMBULATORY_CARE_PROVIDER_SITE_OTHER): Payer: Medicaid Other | Admitting: Obstetrics and Gynecology

## 2020-11-13 ENCOUNTER — Other Ambulatory Visit: Payer: Self-pay

## 2020-11-13 DIAGNOSIS — Z5329 Procedure and treatment not carried out because of patient's decision for other reasons: Secondary | ICD-10-CM

## 2020-11-13 DIAGNOSIS — Z91199 Patient's noncompliance with other medical treatment and regimen due to unspecified reason: Secondary | ICD-10-CM

## 2020-11-13 NOTE — Patient Instructions (Signed)
Oral Glucose Tolerance Test During Pregnancy Why am I having this test? The oral glucose tolerance test (OGTT) is done to check how your body processes blood sugar (glucose). This is one of several tests used to diagnose diabetes that develops during pregnancy (gestational diabetes mellitus). Gestational diabetes is a short-term form of diabetes that some women develop while they are pregnant. It usually occurs during the second trimester of pregnancy and goes away after delivery. Testing, or screening, for gestational diabetes usually occurs at weeks 24-28 of pregnancy. You may have the OGTT test after having a 1-hour glucose screening test if the results from that test indicate that you may have gestational diabetes. This test may also be needed if:  You have a history of gestational diabetes.  There is a history of giving birth to very large babies or of losing pregnancies (having stillbirths).  You have signs and symptoms of diabetes, such as: ? Changes in your eyesight. ? Tingling or numbness in your hands or feet. ? Changes in hunger, thirst, and urination, and these are not explained by your pregnancy. What is being tested? This test measures the amount of glucose in your blood at different times during a period of 3 hours. This shows how well your body can process glucose. What kind of sample is taken? Blood samples are required for this test. They are usually collected by inserting a needle into a blood vessel.   How do I prepare for this test?  For 3 days before your test, eat normally. Have plenty of carbohydrate-rich foods.  Follow instructions from your health care provider about: ? Eating or drinking restrictions on the day of the test. You may be asked not to eat or drink anything other than water (to fast) starting 8-10 hours before the test. ? Changing or stopping your regular medicines. Some medicines may interfere with this test. Tell a health care provider about:  All  medicines you are taking, including vitamins, herbs, eye drops, creams, and over-the-counter medicines.  Any blood disorders you have.  Any surgeries you have had.  Any medical conditions you have. What happens during the test? First, your blood glucose will be measured. This is referred to as your fasting blood glucose because you fasted before the test. Then, you will drink a glucose solution that contains a certain amount of glucose. Your blood glucose will be measured again 1, 2, and 3 hours after you drink the solution. This test takes about 3 hours to complete. You will need to stay at the testing location during this time. During the testing period:  Do not eat or drink anything other than the glucose solution.  Do not exercise.  Do not use any products that contain nicotine or tobacco, such as cigarettes, e-cigarettes, and chewing tobacco. These can affect your test results. If you need help quitting, ask your health care provider. The testing procedure may vary among health care providers and hospitals. How are the results reported? Your results will be reported as milligrams of glucose per deciliter of blood (mg/dL) or millimoles per liter (mmol/L). There is more than one source for screening and diagnosis reference values used to diagnose gestational diabetes. Your health care provider will compare your results to normal values that were established after testing a large group of people (reference values). Reference values may vary among labs and hospitals. For this test (Carpenter-Coustan), reference values are:  Fasting: 95 mg/dL (5.3 mmol/L).  1 hour: 180 mg/dL (10.0 mmol/L).  2 hour:   155 mg/dL (8.6 mmol/L).  3 hour: 140 mg/dL (7.8 mmol/L). What do the results mean? Results below the reference values are considered normal. If two or more of your blood glucose levels are at or above the reference values, you may be diagnosed with gestational diabetes. If only one level is  high, your health care provider may suggest repeat testing or other tests to confirm a diagnosis. Talk with your health care provider about what your results mean. Questions to ask your health care provider Ask your health care provider, or the department that is doing the test:  When will my results be ready?  How will I get my results?  What are my treatment options?  What other tests do I need?  What are my next steps? Summary  The oral glucose tolerance test (OGTT) is one of several tests used to diagnose diabetes that develops during pregnancy (gestational diabetes mellitus). Gestational diabetes is a short-term form of diabetes that some women develop while they are pregnant.  You may have the OGTT test after having a 1-hour glucose screening test if the results from that test show that you may have gestational diabetes. You may also have this test if you have any symptoms or risk factors for this type of diabetes.  Talk with your health care provider about what your results mean. This information is not intended to replace advice given to you by your health care provider. Make sure you discuss any questions you have with your health care provider. Document Revised: 03/14/2020 Document Reviewed: 03/14/2020 Elsevier Patient Education  2021 [employer] Inc.  

## 2020-11-13 NOTE — Progress Notes (Signed)
Patient left before provider initiated My Chart visit. Unable to reach by phone or My Chart message.  Raelyn Mora, CNM

## 2020-12-11 ENCOUNTER — Other Ambulatory Visit: Payer: Self-pay

## 2020-12-11 ENCOUNTER — Ambulatory Visit (INDEPENDENT_AMBULATORY_CARE_PROVIDER_SITE_OTHER): Payer: Medicaid Other | Admitting: Advanced Practice Midwife

## 2020-12-11 ENCOUNTER — Other Ambulatory Visit: Payer: Medicaid Other

## 2020-12-11 VITALS — BP 131/88 | HR 84 | Wt 139.0 lb

## 2020-12-11 DIAGNOSIS — Q513 Bicornate uterus: Secondary | ICD-10-CM

## 2020-12-11 DIAGNOSIS — Z3402 Encounter for supervision of normal first pregnancy, second trimester: Secondary | ICD-10-CM

## 2020-12-11 DIAGNOSIS — Z23 Encounter for immunization: Secondary | ICD-10-CM

## 2020-12-11 DIAGNOSIS — Z3A28 28 weeks gestation of pregnancy: Secondary | ICD-10-CM

## 2020-12-11 DIAGNOSIS — Z131 Encounter for screening for diabetes mellitus: Secondary | ICD-10-CM

## 2020-12-11 DIAGNOSIS — Z3403 Encounter for supervision of normal first pregnancy, third trimester: Secondary | ICD-10-CM

## 2020-12-11 NOTE — Patient Instructions (Signed)
Fetal Movement Counts Patient Name: ________________________________________________ Patient Due Date: ____________________  What is a fetal movement count? A fetal movement count is the number of times that you feel your baby move during a certain amount of time. This may also be called a fetal kick count. A fetal movement count is recommended for every pregnant woman. You may be asked to start counting fetal movements as early as week 28 of your pregnancy. Pay attention to when your baby is most active. You may notice your baby's sleep and wake cycles. You may also notice things that make your baby move more. You should do a fetal movement count:  When your baby is normally most active.  At the same time each day. A good time to count movements is while you are resting, after having something to eat and drink. How do I count fetal movements? 1. Find a quiet, comfortable area. Sit, or lie down on your side. 2. Write down the date, the start time and stop time, and the number of movements that you felt between those two times. Take this information with you to your health care visits. 3. Write down your start time when you feel the first movement. 4. Count kicks, flutters, swishes, rolls, and jabs. You should feel at least 10 movements. 5. You may stop counting after you have felt 10 movements, or if you have been counting for 2 hours. Write down the stop time. 6. If you do not feel 10 movements in 2 hours, contact your health care provider for further instructions. Your health care provider may want to do additional tests to assess your baby's well-being. Contact a health care provider if:  You feel fewer than 10 movements in 2 hours.  Your baby is not moving like he or she usually does. Date: ____________ Start time: ____________ Stop time: ____________ Movements: ____________ Date: ____________ Start time: ____________ Stop time: ____________ Movements: ____________ Date: ____________  Start time: ____________ Stop time: ____________ Movements: ____________ Date: ____________ Start time: ____________ Stop time: ____________ Movements: ____________ Date: ____________ Start time: ____________ Stop time: ____________ Movements: ____________ Date: ____________ Start time: ____________ Stop time: ____________ Movements: ____________ Date: ____________ Start time: ____________ Stop time: ____________ Movements: ____________ Date: ____________ Start time: ____________ Stop time: ____________ Movements: ____________ Date: ____________ Start time: ____________ Stop time: ____________ Movements: ____________ This information is not intended to replace advice given to you by your health care provider. Make sure you discuss any questions you have with your health care provider. Document Revised: 05/25/2019 Document Reviewed: 05/25/2019 Elsevier Patient Education  2021 Elsevier Inc.  

## 2020-12-11 NOTE — Progress Notes (Signed)
   PRENATAL VISIT NOTE  Subjective:  Laura Mayo is a 20 y.o. G1P0000 at [redacted]w[redacted]d being seen today for ongoing prenatal care.  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  Patient reports no complaints.  Contractions: Not present. Vag. Bleeding: None.  Movement: Present. Denies leaking of fluid.   The following portions of the patient's history were reviewed and updated as appropriate: allergies, current medications, past family history, past medical history, past social history, past surgical history and problem list. Problem list updated.  Objective:   Vitals:   12/11/20 0908  BP: 131/88  Pulse: 84  Weight: 139 lb (63 kg)    Fetal Status: Fetal Heart Rate (bpm): 155 Fundal Height: 27 cm Movement: Present     General:  Alert, oriented and cooperative. Patient is in no acute distress.  Skin: Skin is warm and dry. No rash noted.   Cardiovascular: Normal heart rate noted  Respiratory: Normal respiratory effort, no problems with respiration noted  Abdomen: Soft, gravid, appropriate for gestational age.  Pain/Pressure: Absent     Pelvic: Cervical exam deferred        Extremities: Normal range of motion.  Edema: None  Mental Status: Normal mood and affect. Normal behavior. Normal judgment and thought content.   Assessment and Plan:  Pregnancy: G1P0000 at [redacted]w[redacted]d  1. Encounter for supervision of normal first pregnancy in third trimester - LOB - Preemptive teaching daily kick counts at 28-30 weeks, interventions for low kick number  2. [redacted] weeks gestation of pregnancy - TDAP today - Declined Flu  3. Bicornate uterus  Preterm labor symptoms and general obstetric precautions including but not limited to vaginal bleeding, contractions, leaking of fluid and fetal movement were reviewed in detail with the patient. Please refer to After Visit Summary for other  counseling recommendations.  Return in about 2 weeks (around 12/25/2020).  Future Appointments  Date Time Provider Department Center  12/25/2020 11:00 AM Arabella Merles, CNM [facility]    [place], [place]

## 2020-12-12 LAB — CBC
Hematocrit: 33.2 % — ABNORMAL LOW (ref 34.0–46.6)
Hemoglobin: 11.1 g/dL (ref 11.1–15.9)
MCH: 30.7 pg (ref 26.6–33.0)
MCHC: 33.4 g/dL (ref 31.5–35.7)
MCV: 92 fL (ref 79–97)
Platelets: 269 10*3/uL (ref 150–450)
RBC: 3.62 x10E6/uL — ABNORMAL LOW (ref 3.77–5.28)
RDW: 11.4 % — ABNORMAL LOW (ref 11.7–15.4)
WBC: 9.9 10*3/uL (ref 3.4–10.8)

## 2020-12-12 LAB — HIV ANTIBODY (ROUTINE TESTING W REFLEX): HIV Screen 4th Generation wRfx: NONREACTIVE

## 2020-12-12 LAB — GLUCOSE TOLERANCE, 2 HOURS W/ 1HR
Glucose, 1 hour: 107 mg/dL (ref 65–179)
Glucose, 2 hour: 85 mg/dL (ref 65–152)
Glucose, Fasting: 83 mg/dL (ref 65–91)

## 2020-12-12 LAB — ANTIBODY SCREEN: Antibody Screen: NEGATIVE

## 2020-12-12 LAB — RPR: RPR Ser Ql: NONREACTIVE

## 2020-12-25 ENCOUNTER — Other Ambulatory Visit (INDEPENDENT_AMBULATORY_CARE_PROVIDER_SITE_OTHER): Payer: Medicaid Other

## 2020-12-25 ENCOUNTER — Ambulatory Visit (INDEPENDENT_AMBULATORY_CARE_PROVIDER_SITE_OTHER): Payer: Medicaid Other | Admitting: Advanced Practice Midwife

## 2020-12-25 ENCOUNTER — Other Ambulatory Visit: Payer: Self-pay

## 2020-12-25 VITALS — BP 141/93 | HR 132 | Wt 151.0 lb

## 2020-12-25 DIAGNOSIS — Z3402 Encounter for supervision of normal first pregnancy, second trimester: Secondary | ICD-10-CM

## 2020-12-25 DIAGNOSIS — O26842 Uterine size-date discrepancy, second trimester: Secondary | ICD-10-CM

## 2020-12-25 DIAGNOSIS — R030 Elevated blood-pressure reading, without diagnosis of hypertension: Secondary | ICD-10-CM

## 2020-12-25 DIAGNOSIS — Z3A30 30 weeks gestation of pregnancy: Secondary | ICD-10-CM

## 2020-12-25 DIAGNOSIS — O26843 Uterine size-date discrepancy, third trimester: Secondary | ICD-10-CM

## 2020-12-25 DIAGNOSIS — O26849 Uterine size-date discrepancy, unspecified trimester: Secondary | ICD-10-CM | POA: Insufficient documentation

## 2020-12-25 DIAGNOSIS — Z1389 Encounter for screening for other disorder: Secondary | ICD-10-CM

## 2020-12-25 DIAGNOSIS — Z3403 Encounter for supervision of normal first pregnancy, third trimester: Secondary | ICD-10-CM

## 2020-12-25 DIAGNOSIS — O163 Unspecified maternal hypertension, third trimester: Secondary | ICD-10-CM

## 2020-12-25 LAB — POCT URINALYSIS DIPSTICK OB
Blood, UA: NEGATIVE
Glucose, UA: NEGATIVE
Ketones, UA: NEGATIVE
Leukocytes, UA: NEGATIVE
Nitrite, UA: NEGATIVE
POC,PROTEIN,UA: NEGATIVE

## 2020-12-25 NOTE — Progress Notes (Addendum)
LOW-RISK PREGNANCY VISIT Patient name: Laura Mayo MRN [id number]  Date of birth: 05-08-2001 Chief Complaint:   Routine Prenatal Visit  History of Present Illness:   KESHARA KIGER is a 20 y.o. G21P0000 female at 51w0dwith an Estimated Date of Delivery: 03/05/21 being seen today for ongoing management of a low-risk pregnancy.  Today she reports doing well; denies s/s pre-e; having asymptmatic tachycardia. Contractions: Not present. Vag. Bleeding: None.  Movement: Present. denies leaking of fluid. Review of Systems:   Pertinent items are noted in HPI Denies abnormal vaginal discharge w/ itching/odor/irritation, headaches, visual changes, shortness of breath, chest pain, abdominal pain, severe nausea/vomiting, or problems with urination or bowel movements unless otherwise stated above. Pertinent History Reviewed:  Reviewed past medical,surgical, social, obstetrical and family history.  Reviewed problem list, medications and allergies. Physical Assessment:   Vitals:   12/25/20 1101 12/25/20 1102  BP: (!) 149/91 (!) 141/93  Pulse: (!) 122 (!) 132  Weight: 151 lb (68.5 kg)   Body mass index is 25.13 kg/m.        Physical Examination:   General appearance: Well appearing, and in no distress  Mental status: Alert, oriented to person, place, and time  Skin: Warm & dry  Cardiovascular: Normal heart rate noted  Respiratory: Normal respiratory effort, no distress  Abdomen: Soft, gravid, nontender  Pelvic: Cervical exam deferred         Extremities: Edema: None  Fetal Status: Fetal Heart Rate (bpm): 138 Fundal Height: 25 cm Movement: Present    Results for orders placed or performed in visit on 12/25/20 (from the past 24 hour(s))  POC Urinalysis Dipstick OB   Collection Time: 12/25/20 11:05 AM  Result Value Ref Range   Color, UA     Clarity, UA     Glucose, UA Negative Negative   Bilirubin, UA     Ketones, UA neg    Spec Grav, UA     Blood, UA neg    pH, UA      POC,PROTEIN,UA Negative Negative, Trace, Small (1+), Moderate (2+), Large (3+), 4+   Urobilinogen, UA     Nitrite, UA neg    Leukocytes, UA Negative Negative   Appearance     Odor      Assessment & Plan:  1) Low-risk pregnancy G1P0000 at 372w0dith an Estimated Date of Delivery: 03/05/21   2) Elevated BP without dx of gHTN, will get labs today and have BP check 12/27/20; to bring in BP log on Friday and notify usKoreaf she develops symptoms  3) S<D, will try to get growth scan on 3/11, and if no appt will get next wk  ** U/S for EFW able to be worked in today: USKorea0 wks,cephalic,posterior placenta gr 1,afi 14.6 cm,fhr 137 bpm,EFW 1328 g 13% >plan for repeat in 4wks  4) Tachycardia, says heart rate is up and down (120s at times), is always asymptomatic, will watch for now   Meds: No orders of the defined types were placed in this encounter.  Labs/procedures today: CMET, CBC, P/C ratio  Plan:  Continue routine obstetrical care with f/u for elevated BP and small uterine size  Reviewed: Preterm labor symptoms and general obstetric precautions including but not limited to vaginal bleeding, contractions, leaking of fluid and fetal movement were reviewed in detail with the patient.  All questions were answered. Has home bp cuff. Check bp weekly, let usKoreanow if >140/90.   Follow-up: Return in about 2 weeks (around  01/08/2021) for (RN BP check & EFW u/s 3/11); [facility], in person.  Orders Placed This Encounter  Procedures  . US OB Follow Up  . Comp Met (CMET)  . CBC  . Protein / creatinine ratio, urine  . POC Urinalysis Dipstick OB   [facility]rtis Ser [facility] 12/25/2020 11:41 AM

## 2020-12-25 NOTE — Progress Notes (Signed)
[place] 30 wks,cephalic,posterior placenta gr 1,afi 14.6 cm,fhr 137 bpm,EFW 1328 g 13%

## 2020-12-25 NOTE — Patient Instructions (Signed)
Laura Mayo, I greatly value your feedback.  If you receive a survey following your visit with [place] today, we appreciate you taking the time to fill it out.  Thanks, Laura Mayo, CNM   [facility] ([address] [place], [place] [address]) Entrance C, located off of [address] valet parking  Go to [email or web address] to register for FREE online childbirth classes   Call the office [phone]) or go to [facility] if:  You begin to have strong, frequent contractions  Your water breaks.  Sometimes it is a big gush of fluid, sometimes it is just a trickle that keeps getting your panties wet or running down your legs  You have vaginal bleeding.  It is normal to have a small amount of spotting if your cervix was checked.   You don't feel your baby moving like normal.  If you don't, get you something to eat and drink and lay down and focus on feeling your baby move.  You should feel at least 10 movements in 2 hours.  If you don't, you should call the office or go to [facility].    Tdap Vaccine  It is recommended that you get the Tdap vaccine during the third trimester of EACH pregnancy to help protect your baby from getting pertussis (whooping cough)  27-36 weeks is the BEST time to do this so that you can pass the protection on to your baby. During pregnancy is better than after pregnancy, but if you are unable to get it during pregnancy it will be offered at the hospital.   You can get this vaccine with [place], at the health department, your family doctor, or some local pharmacies  Everyone who will be around your baby should also be up-to-date on their vaccines before the baby comes. Adults (who are not pregnant) only need 1 dose of Tdap during adulthood.   [place] Pediatricians/Family Doctors:  [facility] [phone]            [facility] [phone]                 [facility] [phone]  (usually not accepting new patients unless you have family there already, you are always welcome to call and ask)       [facility] Department [phone]       [facility] Pediatricians/Family Doctors:   [facility]: [phone]  [facility]: [phone]  [facility]: [phone]  [facility] Doctors:   [facility]: [phone]   [facility]: [phone]  [facility] Doctors:  [facility]: [phone]   Home Blood Pressure Monitoring for Patients   Your provider has recommended that you check your blood pressure (BP) at least once a week at home. If you do not have a blood pressure cuff at home, one will be provided for you. Contact your provider if you have not received your monitor within 1 week.   Helpful Tips for Accurate Home Blood Pressure Checks  . Don't smoke, exercise, or drink caffeine 30 minutes before checking your BP . Use the restroom before checking your BP (a full bladder can raise your pressure) . Relax in a comfortable upright chair . Feet on the ground . Left arm resting comfortably on a flat surface at the level of your heart . Legs uncrossed . Back supported . Sit quietly and don't talk . Place the cuff on your bare  arm . Adjust snuggly, so that only two fingertips can fit between your skin and the top of the cuff . Check 2 readings separated by at least one minute . Keep a log of your BP readings . For a visual, please reference this diagram: http://ccnc.care/bpdiagram  Provider Name: [facility]     Phone: [phone]  Zone 1: ALL CLEAR  Continue to monitor your symptoms:  . BP reading is less than 140 (top number) or less than 90 (bottom number)  . No right upper stomach pain . No headaches or seeing spots . No feeling nauseated or throwing up . No swelling in face and hands  Zone 2: CAUTION Call your doctor's office for  any of the following:  . BP reading is greater than 140 (top number) or greater than 90 (bottom number)  . Stomach pain under your ribs in the middle or right side . Headaches or seeing spots . Feeling nauseated or throwing up . Swelling in face and hands  Zone 3: EMERGENCY  Seek immediate medical care if you have any of the following:  . BP reading is greater than160 (top number) or greater than 110 (bottom number) . Severe headaches not improving with Tylenol . Serious difficulty catching your breath . Any worsening symptoms from Zone 2   Third Trimester of Pregnancy The third trimester is from week 29 through week 42, months 7 through 9. The third trimester is a time when the fetus is growing rapidly. At the end of the ninth month, the fetus is about 20 inches in length and weighs 6-10 pounds.  BODY CHANGES Your body goes through many changes during pregnancy. The changes vary from woman to woman.   Your weight will continue to increase. You can expect to gain 25-35 pounds (11-16 kg) by the end of the pregnancy.  You may begin to get stretch marks on your hips, abdomen, and breasts.  You may urinate more often because the fetus is moving lower into your pelvis and pressing on your bladder.  You may develop or continue to have heartburn as a result of your pregnancy.  You may develop constipation because certain hormones are causing the muscles that push waste through your intestines to slow down.  You may develop hemorrhoids or swollen, bulging veins (varicose veins).  You may have pelvic pain because of the weight gain and pregnancy hormones relaxing your joints between the bones in your pelvis. Backaches may result from overexertion of the muscles supporting your posture.  You may have changes in your hair. These can include thickening of your hair, rapid growth, and changes in texture. Some women also have hair loss during or after pregnancy, or hair that feels dry or thin.  Your hair will most likely return to normal after your baby is born.  Your breasts will continue to grow and be tender. A yellow discharge may leak from your breasts called colostrum.  Your belly button may stick out.  You may feel short of breath because of your expanding uterus.  You may notice the fetus "dropping," or moving lower in your abdomen.  You may have a bloody mucus discharge. This usually occurs a few days to a week before labor begins.  Your cervix becomes thin and soft (effaced) near your due date. WHAT TO EXPECT AT YOUR PRENATAL EXAMS  You will have prenatal exams every 2 weeks until week 36. Then, you will have weekly prenatal exams. During a routine prenatal visit:  You  will be weighed to make sure you and the fetus are growing normally.  Your blood pressure is taken.  Your abdomen will be measured to track your baby's growth.  The fetal heartbeat will be listened to.  Any test results from the previous visit will be discussed.  You may have a cervical check near your due date to see if you have effaced. At around 36 weeks, your caregiver will check your cervix. At the same time, your caregiver will also perform a test on the secretions of the vaginal tissue. This test is to determine if a type of bacteria, Group B streptococcus, is present. Your caregiver will explain this further. Your caregiver may ask you:  What your birth plan is.  How you are feeling.  If you are feeling the baby move.  If you have had any abnormal symptoms, such as leaking fluid, bleeding, severe headaches, or abdominal cramping.  If you have any questions. Other tests or screenings that may be performed during your third trimester include:  Blood tests that check for low iron levels (anemia).  Fetal testing to check the health, activity level, and growth of the fetus. Testing is done if you have certain medical conditions or if there are problems during the pregnancy. FALSE  LABOR You may feel small, irregular contractions that eventually go away. These are called Braxton Hicks contractions, or false labor. Contractions may last for hours, days, or even weeks before true labor sets in. If contractions come at regular intervals, intensify, or become painful, it is best to be seen by your caregiver.  SIGNS OF LABOR   Menstrual-like cramps.  Contractions that are 5 minutes apart or less.  Contractions that start on the top of the uterus and spread down to the lower abdomen and back.  A sense of increased pelvic pressure or back pain.  A watery or bloody mucus discharge that comes from the vagina. If you have any of these signs before the 37th week of pregnancy, call your caregiver right away. You need to go to the hospital to get checked immediately. HOME CARE INSTRUCTIONS   Avoid all smoking, herbs, alcohol, and unprescribed drugs. These chemicals affect the formation and growth of the baby.  Follow your caregiver's instructions regarding medicine use. There are medicines that are either safe or unsafe to take during pregnancy.  Exercise only as directed by your caregiver. Experiencing uterine cramps is a good sign to stop exercising.  Continue to eat regular, healthy meals.  Wear a good support bra for breast tenderness.  Do not use hot tubs, steam rooms, or saunas.  Wear your seat belt at all times when driving.  Avoid raw meat, uncooked cheese, cat litter boxes, and soil used by cats. These carry germs that can cause birth defects in the baby.  Take your prenatal vitamins.  Try taking a stool softener (if your caregiver approves) if you develop constipation. Eat more high-fiber foods, such as fresh vegetables or fruit and whole grains. Drink plenty of fluids to keep your urine clear or pale yellow.  Take warm sitz baths to soothe any pain or discomfort caused by hemorrhoids. Use hemorrhoid cream if your caregiver approves.  If you develop varicose  veins, wear support hose. Elevate your feet for 15 minutes, 3-4 times a day. Limit salt in your diet.  Avoid heavy lifting, wear low heal shoes, and practice good posture.  Rest a lot with your legs elevated if you have leg cramps or low back  pain.  Visit your dentist if you have not gone during your pregnancy. Use a soft toothbrush to brush your teeth and be gentle when you floss.  A sexual relationship may be continued unless your caregiver directs you otherwise.  Do not travel far distances unless it is absolutely necessary and only with the approval of your caregiver.  Take prenatal classes to understand, practice, and ask questions about the labor and delivery.  Make a trial run to the hospital.  Pack your hospital bag.  Prepare the baby's nursery.  Continue to go to all your prenatal visits as directed by your caregiver. SEEK MEDICAL CARE IF:  You are unsure if you are in labor or if your water has broken.  You have dizziness.  You have mild pelvic cramps, pelvic pressure, or nagging pain in your abdominal area.  You have persistent nausea, vomiting, or diarrhea.  You have a bad smelling vaginal discharge.  You have pain with urination. SEEK IMMEDIATE MEDICAL CARE IF:   You have a fever.  You are leaking fluid from your vagina.  You have spotting or bleeding from your vagina.  You have severe abdominal cramping or pain.  You have rapid weight loss or gain.  You have shortness of breath with chest pain.  You notice sudden or extreme swelling of your face, hands, ankles, feet, or legs.  You have not felt your baby move in over an hour.  You have severe headaches that do not go away with medicine.  You have vision changes. Document Released: 09/29/2001 Document Revised: 10/10/2013 Document Reviewed: 12/06/2012 [employer] Patient Information 2015 [place], [place]. This information is not intended to replace advice given to you by your health care provider. Make  sure you discuss any questions you have with your health care provider.

## 2020-12-26 LAB — COMPREHENSIVE METABOLIC PANEL
ALT: 8 IU/L (ref 0–32)
AST: 14 IU/L (ref 0–40)
Albumin/Globulin Ratio: 1.6 (ref 1.2–2.2)
Albumin: 4.2 g/dL (ref 3.9–5.0)
Alkaline Phosphatase: 78 IU/L (ref 42–106)
BUN/Creatinine Ratio: 9 (ref 9–23)
BUN: 5 mg/dL — ABNORMAL LOW (ref 6–20)
Bilirubin Total: 0.4 mg/dL (ref 0.0–1.2)
CO2: 20 mmol/L (ref 20–29)
Calcium: 9.6 mg/dL (ref 8.7–10.2)
Chloride: 100 mmol/L (ref 96–106)
Creatinine, Ser: 0.56 mg/dL — ABNORMAL LOW (ref 0.57–1.00)
Globulin, Total: 2.7 g/dL (ref 1.5–4.5)
Glucose: 94 mg/dL (ref 65–99)
Potassium: 4.2 mmol/L (ref 3.5–5.2)
Sodium: 135 mmol/L (ref 134–144)
Total Protein: 6.9 g/dL (ref 6.0–8.5)
eGFR: 135 mL/min/{1.73_m2} (ref >59)

## 2020-12-26 LAB — CBC
Hematocrit: 35.9 % (ref 34.0–46.6)
Hemoglobin: 12.1 g/dL (ref 11.1–15.9)
MCH: 31 pg (ref 26.6–33.0)
MCHC: 33.7 g/dL (ref 31.5–35.7)
MCV: 92 fL (ref 79–97)
Platelets: 284 10*3/uL (ref 150–450)
RBC: 3.9 x10E6/uL (ref 3.77–5.28)
RDW: 11.5 % — ABNORMAL LOW (ref 11.7–15.4)
WBC: 10.3 10*3/uL (ref 3.4–10.8)

## 2020-12-26 LAB — PROTEIN / CREATININE RATIO, URINE
Creatinine, Urine: 34.9 mg/dL
Protein, Ur: 6.5 mg/dL
Protein/Creat Ratio: 186 mg/g creat (ref 0–200)

## 2021-01-23 ENCOUNTER — Other Ambulatory Visit: Payer: Self-pay

## 2021-01-23 ENCOUNTER — Ambulatory Visit (INDEPENDENT_AMBULATORY_CARE_PROVIDER_SITE_OTHER): Payer: Medicaid Other | Admitting: Obstetrics & Gynecology

## 2021-01-23 VITALS — BP 135/92 | HR 124 | Wt 144.0 lb

## 2021-01-23 DIAGNOSIS — Z1389 Encounter for screening for other disorder: Secondary | ICD-10-CM

## 2021-01-23 DIAGNOSIS — O133 Gestational [pregnancy-induced] hypertension without significant proteinuria, third trimester: Secondary | ICD-10-CM

## 2021-01-23 DIAGNOSIS — O0993 Supervision of high risk pregnancy, unspecified, third trimester: Secondary | ICD-10-CM

## 2021-01-23 LAB — POCT URINALYSIS DIPSTICK OB
Blood, UA: NEGATIVE
Glucose, UA: NEGATIVE
Ketones, UA: NEGATIVE
Leukocytes, UA: NEGATIVE
Nitrite, UA: NEGATIVE
POC,PROTEIN,UA: NEGATIVE

## 2021-01-23 NOTE — Progress Notes (Signed)
HIGH-RISK PREGNANCY VISIT Patient name: Laura Mayo MRN [id number]  Date of birth: 07-22-2001 Chief Complaint:   Routine Prenatal Visit  History of Present Illness:   Laura Mayo is a 20 y.o. G76P0000 female at [redacted]w[redacted]d with an Estimated Date of Delivery: 03/05/21 being seen today for ongoing management of a high-risk pregnancy complicated by gestational hypertension.  Today she reports no complaints.  Depression screen [facility] 2/9 12/11/2020 08/26/2020  Decreased Interest 0 0  Down, Depressed, Hopeless 0 0  PHQ - 2 Score 0 0  Altered sleeping 0 0  Tired, decreased energy 0 0  Change in appetite 0 0  Feeling bad or failure about yourself  0 0  Trouble concentrating 0 0  Moving slowly or fidgety/restless 0 0  Suicidal thoughts 0 0  PHQ-9 Score 0 0    Contractions: Irritability. Vag. Bleeding: None.  Movement: Present. denies leaking of fluid.  Review of Systems:   Pertinent items are noted in HPI Denies abnormal vaginal discharge w/ itching/odor/irritation, headaches, visual changes, shortness of breath, chest pain, abdominal pain, severe nausea/vomiting, or problems with urination or bowel movements unless otherwise stated above. Pertinent History Reviewed:  Reviewed past medical,surgical, social, obstetrical and family history.  Reviewed problem list, medications and allergies. Physical Assessment:   Vitals:   01/23/21 1449  BP: (!) 135/92  Pulse: (!) 124  Weight: 144 lb (65.3 kg)  Body mass index is 23.96 kg/m.           Physical Examination:   General appearance: alert, well appearing, and in no distress  Mental status: alert, oriented to person, place, and time  Skin: warm & dry   Extremities: Edema: None    Cardiovascular: normal heart rate noted  Respiratory: normal respiratory effort, no distress  Abdomen: gravid, soft, non-tender  Pelvic: Cervical exam deferred         Fetal Status: Fetal Heart Rate (bpm): 142 Fundal Height: 33 cm Movement: Present    Fetal  Surveillance Testing today: FHR 144   Chaperone: N/A    Results for orders placed or performed in visit on 01/23/21 (from the past 24 hour(s))  POC Urinalysis Dipstick OB   Collection Time: 01/23/21  3:19 PM  Result Value Ref Range   Color, UA     Clarity, UA     Glucose, UA Negative Negative   Bilirubin, UA     Ketones, UA neg    Spec Grav, UA     Blood, UA neg    pH, UA     POC,PROTEIN,UA Negative Negative, Trace, Small (1+), Moderate (2+), Large (3+), 4+   Urobilinogen, UA     Nitrite, UA neg    Leukocytes, UA Negative Negative   Appearance     Odor      Assessment & Plan:  High-risk pregnancy: G1P0000 at [redacted]w[redacted]d with an Estimated Date of Delivery: 03/05/21   1) Gestational Hypertension, stable, pt missed her scheduled follow up last month and did not reschedule her appt, will need twice weekly surveilance and EFW with previous 13%, has a bicornuate uterus which may contribute to the EFW being lower    Meds: No orders of the defined types were placed in this encounter.   Labs/procedures today: none  Treatment Plan:  As above  Reviewed: Preterm labor symptoms and general obstetric precautions including but not limited to vaginal bleeding, contractions, leaking of fluid and fetal movement were reviewed in detail with the patient.  All questions were answered. Does have  home bp cuff. Office bp cuff given: not applicable. Check bp daily, let us know if consistently >140 and/or >90.  Follow-up: Return in about 4 days (around 01/27/2021) for sonogram for growth, BPP, BPP/sono, Dopplers, HROB.   No future appointments.  Orders Placed This Encounter  Procedures  . US Fetal BPP W/O Non Stress  . [place] UA Cord Doppler  . US OB Follow Up  . POC Urinalysis Dipstick OB   Lazaro Arms  01/23/2021 3:34 PM

## 2021-01-28 ENCOUNTER — Other Ambulatory Visit: Payer: Self-pay

## 2021-01-28 ENCOUNTER — Ambulatory Visit (INDEPENDENT_AMBULATORY_CARE_PROVIDER_SITE_OTHER): Payer: Medicaid Other | Admitting: Obstetrics & Gynecology

## 2021-01-28 ENCOUNTER — Ambulatory Visit (INDEPENDENT_AMBULATORY_CARE_PROVIDER_SITE_OTHER): Payer: Medicaid Other

## 2021-01-28 VITALS — BP 133/93 | HR 106 | Wt 144.0 lb

## 2021-01-28 DIAGNOSIS — O365990 Maternal care for other known or suspected poor fetal growth, unspecified trimester, not applicable or unspecified: Secondary | ICD-10-CM | POA: Insufficient documentation

## 2021-01-28 DIAGNOSIS — O133 Gestational [pregnancy-induced] hypertension without significant proteinuria, third trimester: Secondary | ICD-10-CM | POA: Diagnosis not present

## 2021-01-28 DIAGNOSIS — O0993 Supervision of high risk pregnancy, unspecified, third trimester: Secondary | ICD-10-CM

## 2021-01-28 DIAGNOSIS — Z3A34 34 weeks gestation of pregnancy: Secondary | ICD-10-CM

## 2021-01-28 DIAGNOSIS — Z1389 Encounter for screening for other disorder: Secondary | ICD-10-CM

## 2021-01-28 DIAGNOSIS — R030 Elevated blood-pressure reading, without diagnosis of hypertension: Secondary | ICD-10-CM

## 2021-01-28 DIAGNOSIS — Z3403 Encounter for supervision of normal first pregnancy, third trimester: Secondary | ICD-10-CM

## 2021-01-28 DIAGNOSIS — Q513 Bicornate uterus: Secondary | ICD-10-CM

## 2021-01-28 DIAGNOSIS — O26843 Uterine size-date discrepancy, third trimester: Secondary | ICD-10-CM

## 2021-01-28 NOTE — Progress Notes (Signed)
HIGH-RISK PREGNANCY VISIT Patient name: Laura Mayo MRN [id number]  Date of birth: 2001-03-21 Chief Complaint:   Routine Prenatal Visit ([place] today)  History of Present Illness:   Laura Mayo is a 20 y.o. G68P0000 female at [redacted]w[redacted]d with an Estimated Date of Delivery: 03/05/21 being seen today for ongoing management of a high-risk pregnancy complicated by: -Small for dates- Last growth 13%, repeat growth today -Gestational HTN- diagnosed @ 34wks, no meds, currently asymptomatic -bicornuate uterus  Today she reports no complaints.   Contractions: Not present. Vag. Bleeding: None.  Movement: Present. denies leaking of fluid.   Depression screen [facility] 2/9 12/11/2020 08/26/2020  Decreased Interest 0 0  Down, Depressed, Hopeless 0 0  PHQ - 2 Score 0 0  Altered sleeping 0 0  Tired, decreased energy 0 0  Change in appetite 0 0  Feeling bad or failure about yourself  0 0  Trouble concentrating 0 0  Moving slowly or fidgety/restless 0 0  Suicidal thoughts 0 0  PHQ-9 Score 0 0     Current Outpatient Medications  Medication Instructions  . Blood Pressure Monitor MISC For regular home bp monitoring during pregnancy  . Prenatal Vit-Fe Fumarate-FA (PRENATAL VITAMIN PO) Oral     Review of Systems:   Pertinent items are noted in HPI Denies abnormal vaginal discharge w/ itching/odor/irritation, headaches, visual changes, shortness of breath, chest pain, abdominal pain, severe nausea/vomiting, or problems with urination or bowel movements unless otherwise stated above. Pertinent History Reviewed:  Reviewed past medical,surgical, social, obstetrical and family history.  Reviewed problem list, medications and allergies. Physical Assessment:   Vitals:   01/28/21 1545 01/28/21 1548  BP: (!) 142/88 (!) 133/93  Pulse: (!) 103 (!) 106  Weight: 144 lb (65.3 kg)   Body mass index is 23.96 kg/m.           Physical Examination:   General appearance: alert, well appearing, and in no  distress  Mental status: alert, oriented to person, place, and time  Skin: warm & dry   Extremities: Edema: None    Cardiovascular: normal heart rate noted  Respiratory: normal respiratory effort, no distress  Abdomen: gravid, soft, non-tender  Pelvic: Cervical exam deferred         Fetal Status: Fetal Heart Rate (bpm): 140   Movement: Present    Fetal Surveillance Testing today: BPP and growth [place] 34+6 wks,cephalic,BPP 8/8,posterior placenta gr 2,fhr 177 bpm,RI .64,.61,.68,.54,.62=66%,EFW 2028 g 5.8%,AC 3.9%,AFI 14 cm   Chaperone: N/A    No results found for this or any previous visit (from the past 24 hour(s)).   Assessment & Plan:  High-risk pregnancy: G1P0000 at [redacted]w[redacted]d with an Estimated Date of Delivery: 03/05/21   1) Fetal growth Restriction [place] today with EFW 5.8%, AC 3.9%, normal dopplers -plan for BPP/dopplers weekly -growth q 3wks -twice weekly testing  2) Gestational HTN -pt has BP cuff and checking at home   Meds: No orders of the defined types were placed in this encounter.   Labs/procedures today: BPP/growth scan  Treatment Plan:  OB care as outlined above  Reviewed: Preterm labor symptoms and general obstetric precautions including but not limited to vaginal bleeding, contractions, leaking of fluid and fetal movement were reviewed in detail with the patient.  All questions were answered. Pt has home bp cuff. Check bp weekly, let us know if >140/90.   Follow-up: Return in about 1 week (around 02/04/2021) for needs twice weekly testing NST/BPP, HROB visit.   Future Appointments  Date Time Provider Department Center  01/30/2021  1:50 PM [facility] NURSE [facility]    No orders of the defined types were placed in this encounter.   Myna Hidalgo, DO Attending Obstetrician & Gynecologist, [facility] for [employer], [facility]

## 2021-01-28 NOTE — Progress Notes (Signed)
[place] 34+6 wks,cephalic,BPP 8/8,posterior placenta gr 2,fhr 177 bpm,RI .64,.61,.68,.54,.62=66%,EFW 2028 g 5.8%,AC 3.9%

## 2021-01-30 ENCOUNTER — Other Ambulatory Visit: Payer: Self-pay

## 2021-01-30 ENCOUNTER — Ambulatory Visit (INDEPENDENT_AMBULATORY_CARE_PROVIDER_SITE_OTHER): Payer: Medicaid Other | Admitting: *Deleted

## 2021-01-30 DIAGNOSIS — O0993 Supervision of high risk pregnancy, unspecified, third trimester: Secondary | ICD-10-CM

## 2021-01-30 DIAGNOSIS — O133 Gestational [pregnancy-induced] hypertension without significant proteinuria, third trimester: Secondary | ICD-10-CM

## 2021-01-30 DIAGNOSIS — O26843 Uterine size-date discrepancy, third trimester: Secondary | ICD-10-CM

## 2021-01-30 DIAGNOSIS — R030 Elevated blood-pressure reading, without diagnosis of hypertension: Secondary | ICD-10-CM

## 2021-01-30 DIAGNOSIS — O139 Gestational [pregnancy-induced] hypertension without significant proteinuria, unspecified trimester: Secondary | ICD-10-CM | POA: Insufficient documentation

## 2021-01-30 NOTE — Progress Notes (Addendum)
.    NURSE VISIT- NST  SUBJECTIVE:  Laura Mayo is a 20 y.o. G1P0000 female at [redacted]w[redacted]d, here for a NST for pregnancy complicated by [facility].  She reports active fetal movement, contractions: none, vaginal bleeding: none, membranes: intact.   OBJECTIVE:  BP 125/87   Pulse (!) 115   Wt 145 lb (65.8 kg)   LMP 05/29/2020 (Approximate)   BMI 24.13 kg/m   Appears well, no apparent distress  No results found for this or any previous visit (from the past 24 hour(s)).  NST: FHR baseline 130 bpm, Variability: moderate, Accelerations:present, Decelerations:  Absent= Cat /reactive Toco: none   ASSESSMENT: G1P0000 at [redacted]w[redacted]d with GHTN NST reactive  PLAN: EFM strip reviewed by Joellyn Haff, CNM, [facility]   Recommendations: keep next appointment as scheduled    Jobe Marker  01/30/2021 4:38 PM  Chart reviewed for nurse visit. Agree with plan of care.  Cheral Marker, [place] 01/30/2021 5:07 PM

## 2021-02-03 ENCOUNTER — Other Ambulatory Visit: Payer: Self-pay

## 2021-02-03 ENCOUNTER — Ambulatory Visit (INDEPENDENT_AMBULATORY_CARE_PROVIDER_SITE_OTHER): Payer: Medicaid Other | Admitting: *Deleted

## 2021-02-03 VITALS — BP 139/92 | HR 126 | Wt 148.0 lb

## 2021-02-03 DIAGNOSIS — R030 Elevated blood-pressure reading, without diagnosis of hypertension: Secondary | ICD-10-CM

## 2021-02-03 DIAGNOSIS — O133 Gestational [pregnancy-induced] hypertension without significant proteinuria, third trimester: Secondary | ICD-10-CM | POA: Diagnosis not present

## 2021-02-03 DIAGNOSIS — O0993 Supervision of high risk pregnancy, unspecified, third trimester: Secondary | ICD-10-CM | POA: Diagnosis not present

## 2021-02-03 DIAGNOSIS — O26843 Uterine size-date discrepancy, third trimester: Secondary | ICD-10-CM

## 2021-02-03 NOTE — Progress Notes (Addendum)
   NURSE VISIT- NST  SUBJECTIVE:  Laura Mayo is a 20 y.o. G1P0000 female at [redacted]w[redacted]d, here for a NST for pregnancy complicated by [facility].  She reports active fetal movement, contractions: none, vaginal bleeding: none, membranes: intact.   OBJECTIVE:  BP (!) 139/92   Pulse (!) 126   Wt 148 lb (67.1 kg)   LMP 05/29/2020 (Approximate)   BMI 24.63 kg/m   Appears well, no apparent distress  No results found for this or any previous visit (from the past 24 hour(s)).  NST: FHR baseline 135 bpm, Variability: moderate, Accelerations:present, Decelerations:  Absent= Cat 1/reactive Toco: none ASSESSMENT: G1P0000 at [redacted]w[redacted]d with GHTN NST reactive  PLAN: EFM strip reviewed by Joellyn Haff, CNM, [facility]   Recommendations: keep next appointment as scheduled    Jobe Marker  02/03/2021 4:45 PM   Chart reviewed for nurse visit. Agree with plan of care.  Cheral Marker, [place] 02/04/2021 9:07 AM

## 2021-02-05 ENCOUNTER — Other Ambulatory Visit: Payer: Self-pay | Admitting: Obstetrics & Gynecology

## 2021-02-05 DIAGNOSIS — IMO0002 Reserved for concepts with insufficient information to code with codable children: Secondary | ICD-10-CM

## 2021-02-06 ENCOUNTER — Other Ambulatory Visit: Payer: Self-pay

## 2021-02-06 ENCOUNTER — Ambulatory Visit (INDEPENDENT_AMBULATORY_CARE_PROVIDER_SITE_OTHER): Payer: Medicaid Other | Admitting: Obstetrics & Gynecology

## 2021-02-06 ENCOUNTER — Encounter: Payer: Self-pay | Admitting: Obstetrics & Gynecology

## 2021-02-06 ENCOUNTER — Ambulatory Visit (INDEPENDENT_AMBULATORY_CARE_PROVIDER_SITE_OTHER): Payer: Medicaid Other

## 2021-02-06 ENCOUNTER — Other Ambulatory Visit (HOSPITAL_COMMUNITY)
Admission: RE | Admit: 2021-02-06 | Discharge: 2021-02-06 | Disposition: A | Discharge status: Home / Self Care | Payer: Medicaid Other | Source: Ambulatory Visit | Attending: Obstetrics & Gynecology | Admitting: Obstetrics & Gynecology

## 2021-02-06 VITALS — BP 130/86 | HR 103 | Wt 148.0 lb

## 2021-02-06 DIAGNOSIS — IMO0002 Reserved for concepts with insufficient information to code with codable children: Secondary | ICD-10-CM

## 2021-02-06 DIAGNOSIS — O133 Gestational [pregnancy-induced] hypertension without significant proteinuria, third trimester: Secondary | ICD-10-CM

## 2021-02-06 DIAGNOSIS — O365910 Maternal care for other known or suspected poor fetal growth, first trimester, not applicable or unspecified: Secondary | ICD-10-CM

## 2021-02-06 DIAGNOSIS — Z3A36 36 weeks gestation of pregnancy: Secondary | ICD-10-CM

## 2021-02-06 DIAGNOSIS — O26843 Uterine size-date discrepancy, third trimester: Secondary | ICD-10-CM

## 2021-02-06 DIAGNOSIS — O0993 Supervision of high risk pregnancy, unspecified, third trimester: Secondary | ICD-10-CM | POA: Insufficient documentation

## 2021-02-06 DIAGNOSIS — R030 Elevated blood-pressure reading, without diagnosis of hypertension: Secondary | ICD-10-CM

## 2021-02-06 LAB — POCT URINALYSIS DIPSTICK OB
Blood, UA: NEGATIVE
Glucose, UA: NEGATIVE
Ketones, UA: NEGATIVE
Leukocytes, UA: NEGATIVE
Nitrite, UA: NEGATIVE
POC,PROTEIN,UA: NEGATIVE

## 2021-02-06 NOTE — Progress Notes (Signed)
[place] 36+1 wks,cephalic,posterior placenta gr 2,FHR  133 bpm,RI .62,.58,.63,.60=67%,AFI 18.5 cm,BPP 8/8

## 2021-02-06 NOTE — Progress Notes (Signed)
HIGH-RISK PREGNANCY VISIT Patient name: Laura Mayo MRN [id number]  Date of birth: 2001/07/16 Chief Complaint:   Routine Prenatal Visit  History of Present Illness:   Laura Mayo is a 20 y.o. G73P0000 female at [redacted]w[redacted]d with an Estimated Date of Delivery: 03/05/21 being seen today for ongoing management of a high-risk pregnancy complicated by   IUGRI- Last [place] 4/12- EFW 5.8%, AC 3.9%, normal dopplers -Gestational HTN- diagnosed @ 34wks, no meds, currently asymptomatic -bicornuate uterus  Today she reports 2 nights ago- had irregular cramping- lasted for about ..   Contractions: Not present. Vag. Bleeding: None.  Movement: Present. denies leaking of fluid.   Depression screen [facility] 2/9 12/11/2020 08/26/2020  Decreased Interest 0 0  Down, Depressed, Hopeless 0 0  PHQ - 2 Score 0 0  Altered sleeping 0 0  Tired, decreased energy 0 0  Change in appetite 0 0  Feeling bad or failure about yourself  0 0  Trouble concentrating 0 0  Moving slowly or fidgety/restless 0 0  Suicidal thoughts 0 0  PHQ-9 Score 0 0     Current Outpatient Medications  Medication Instructions  . Blood Pressure Monitor MISC For regular home bp monitoring during pregnancy  . Prenatal Vit-Fe Fumarate-FA (PRENATAL VITAMIN PO) Oral     Review of Systems:   Pertinent items are noted in HPI Denies abnormal vaginal discharge w/ itching/odor/irritation, headaches, visual changes, shortness of breath, chest pain, abdominal pain, severe nausea/vomiting, or problems with urination or bowel movements unless otherwise stated above. Pertinent History Reviewed:  Reviewed past medical,surgical, social, obstetrical and family history.  Reviewed problem list, medications and allergies. Physical Assessment:   Vitals:   02/06/21 1205  BP: 130/86  Pulse: (!) 103  Weight: 148 lb (67.1 kg)  Body mass index is 24.63 kg/m.           Physical Examination:   General appearance: alert, well appearing, and in no  distress  Mental status: alert, oriented to person, place, and time  Skin: warm & dry   Extremities: Edema: None    Cardiovascular: normal heart rate noted  Respiratory: normal respiratory effort, no distress  Abdomen: gravid, soft, non-tender  Pelvic: Cervical exam performed  Dilation: Closed Effacement (%): Thick Station: -3  Fetal Status: Fetal Heart Rate (bpm): 133 by [place]   Movement: Present Presentation: Vertex  Fetal Surveillance Testing today: BPP today-   cephalic,posterior placenta gr 2,FHR  133 bpm,RI .62,.58,.63,.60=67%,AFI 18.5 cm,BPP 8/8  Chaperone: [employer]    Results for orders placed or performed in visit on 02/06/21 (from the past 24 hour(s))  POC Urinalysis Dipstick OB   Collection Time: 02/06/21 12:09 PM  Result Value Ref Range   Color, UA     Clarity, UA     Glucose, UA Negative Negative   Bilirubin, UA     Ketones, UA neg    Spec Grav, UA     Blood, UA neg    pH, UA     POC,PROTEIN,UA Negative Negative, Trace, Small (1+), Moderate (2+), Large (3+), 4+   Urobilinogen, UA     Nitrite, UA neg    Leukocytes, UA Negative Negative   Appearance     Odor       Assessment & Plan:  High-risk pregnancy: G1P0000 at [redacted]w[redacted]d with an Estimated Date of Delivery: 03/05/21   1) IUGR BPP 8/8, normal dopplers today Last [place] 4/12- EFW 5.8% []  IOL 38wks to be scheduled next week  2) gestational HTN- BP  within normal limits, no meds currently Reviewed preeclampsia precautions  Meds: No orders of the defined types were placed in this encounter.   Labs/procedures today: GC/C, GBS collected  Treatment Plan:  Continue BPP/doppler weekly, plan to schedule IOL next week  Reviewed: Term labor symptoms and general obstetric precautions including but not limited to vaginal bleeding, contractions, leaking of fluid and fetal movement were reviewed in detail with the patient.  All questions were answered. Pt has home bp cuff. Check bp weekly, let us know if >140/90.    Follow-up: Return in about 1 week (around 02/13/2021) for as scheduled 1wk- HROB visit and BPP/doppler.   Future Appointments  Date Time Provider Department Center  02/10/2021  3:50 PM [facility] NURSE [facility]  02/13/2021  2:30 PM [facility] [place] [facility] None  02/13/2021  3:50 PM Lazaro Arms, MD [facility]  02/17/2021  3:30 PM [facility] NURSE [facility]  02/24/2021  3:50 PM [facility] NURSE [facility]  02/27/2021  3:15 PM [facility] [place] [facility] None  02/27/2021  4:10 PM Lazaro Arms, MD [facility]  03/03/2021  3:50 PM [facility] NURSE [facility]    Orders Placed This Encounter  Procedures  . Culture, beta strep (group b only)  . POC Urinalysis Dipstick OB    Myna Hidalgo, DO Attending Obstetrician & Gynecologist, [facility] for [employer], [facility]

## 2021-02-07 LAB — CERVICOVAGINAL ANCILLARY ONLY
Chlamydia: NEGATIVE
Comment: NEGATIVE
Comment: NORMAL
Neisseria Gonorrhea: NEGATIVE

## 2021-02-10 ENCOUNTER — Other Ambulatory Visit: Payer: Self-pay

## 2021-02-10 ENCOUNTER — Ambulatory Visit (INDEPENDENT_AMBULATORY_CARE_PROVIDER_SITE_OTHER): Payer: Medicaid Other | Admitting: *Deleted

## 2021-02-10 DIAGNOSIS — R030 Elevated blood-pressure reading, without diagnosis of hypertension: Secondary | ICD-10-CM

## 2021-02-10 DIAGNOSIS — O365910 Maternal care for other known or suspected poor fetal growth, first trimester, not applicable or unspecified: Secondary | ICD-10-CM

## 2021-02-10 DIAGNOSIS — O133 Gestational [pregnancy-induced] hypertension without significant proteinuria, third trimester: Secondary | ICD-10-CM

## 2021-02-10 DIAGNOSIS — O0993 Supervision of high risk pregnancy, unspecified, third trimester: Secondary | ICD-10-CM

## 2021-02-10 LAB — CULTURE, BETA STREP (GROUP B ONLY): Strep Gp B Culture: NEGATIVE

## 2021-02-10 NOTE — Progress Notes (Addendum)
   NURSE VISIT- NST  SUBJECTIVE:  Laura Mayo is a 20 y.o. G1P0000 female at [redacted]w[redacted]d, here for a NST for pregnancy complicated by FGR.and GHTN She reports active fetal movement, contractions: none, vaginal bleeding: none, membranes: intact.   OBJECTIVE:  BP 125/84   Pulse 100   Wt 147 lb 3.2 oz (66.8 kg)   LMP 05/29/2020 (Approximate)   BMI 24.50 kg/m   Appears well, no apparent distress  No results found for this or any previous visit (from the past 24 hour(s)).  NST: FHR baseline 135 bpm, Variability: moderate, Accelerations:present, Decelerations:  Absent= Cat 1/reactive Toco: none   ASSESSMENT: G1P0000 at [redacted]w[redacted]d with FGR and GHTN NST reactive  PLAN: EFM strip reviewed by Dr. Despina Hidden   Recommendations: keep next appointment as scheduled    Jobe Marker  02/10/2021 4:46 PM  Attestation of Attending Supervision of Nursing Visit Encounter: Evaluation and management procedures were performed by the nursing staff under my supervision and collaboration.  I have reviewed the nurse's note and chart, and I agree with the management and plan.  Rockne Coons MD Attending Physician for the Center for [facility] 02/10/2021 5:03 PM

## 2021-02-12 ENCOUNTER — Other Ambulatory Visit: Payer: Self-pay | Admitting: Obstetrics & Gynecology

## 2021-02-12 DIAGNOSIS — IMO0002 Reserved for concepts with insufficient information to code with codable children: Secondary | ICD-10-CM

## 2021-02-12 DIAGNOSIS — O133 Gestational [pregnancy-induced] hypertension without significant proteinuria, third trimester: Secondary | ICD-10-CM

## 2021-02-12 DIAGNOSIS — O3403 Maternal care for unspecified congenital malformation of uterus, third trimester: Secondary | ICD-10-CM

## 2021-02-13 ENCOUNTER — Ambulatory Visit (INDEPENDENT_AMBULATORY_CARE_PROVIDER_SITE_OTHER): Payer: Medicaid Other

## 2021-02-13 ENCOUNTER — Encounter: Payer: Self-pay | Admitting: Obstetrics & Gynecology

## 2021-02-13 ENCOUNTER — Ambulatory Visit (INDEPENDENT_AMBULATORY_CARE_PROVIDER_SITE_OTHER): Payer: Medicaid Other | Admitting: Obstetrics & Gynecology

## 2021-02-13 ENCOUNTER — Other Ambulatory Visit: Payer: Self-pay

## 2021-02-13 VITALS — BP 133/87 | HR 118 | Wt 147.0 lb

## 2021-02-13 DIAGNOSIS — Z1389 Encounter for screening for other disorder: Secondary | ICD-10-CM

## 2021-02-13 DIAGNOSIS — O133 Gestational [pregnancy-induced] hypertension without significant proteinuria, third trimester: Secondary | ICD-10-CM | POA: Diagnosis not present

## 2021-02-13 DIAGNOSIS — O0993 Supervision of high risk pregnancy, unspecified, third trimester: Secondary | ICD-10-CM

## 2021-02-13 DIAGNOSIS — Q513 Bicornate uterus: Secondary | ICD-10-CM | POA: Diagnosis not present

## 2021-02-13 DIAGNOSIS — R030 Elevated blood-pressure reading, without diagnosis of hypertension: Secondary | ICD-10-CM

## 2021-02-13 DIAGNOSIS — IMO0002 Reserved for concepts with insufficient information to code with codable children: Secondary | ICD-10-CM

## 2021-02-13 DIAGNOSIS — O3403 Maternal care for unspecified congenital malformation of uterus, third trimester: Secondary | ICD-10-CM

## 2021-02-13 LAB — POCT URINALYSIS DIPSTICK OB
Blood, UA: NEGATIVE
Glucose, UA: NEGATIVE
Ketones, UA: NEGATIVE
Leukocytes, UA: NEGATIVE
Nitrite, UA: NEGATIVE
POC,PROTEIN,UA: NEGATIVE

## 2021-02-13 NOTE — Progress Notes (Signed)
HIGH-RISK PREGNANCY VISIT Patient name: Laura Mayo MRN [id number]  Date of birth: 20-Sep-2001 Chief Complaint:   Routine Prenatal Visit, High Risk Gestation, and Pregnancy Ultrasound  History of Present Illness:   AURIELLE SLINGERLAND is a 20 y.o. G96P0000 female at [redacted]w[redacted]d with an Estimated Date of Delivery: 03/05/21 being seen today for ongoing management of a high-risk pregnancy complicated by fetal growth restriction 5.8%% and gestational hypertension currently on no meds.  Today she reports no complaints.  Depression screen [facility] 2/9 12/11/2020 08/26/2020  Decreased Interest 0 0  Down, Depressed, Hopeless 0 0  PHQ - 2 Score 0 0  Altered sleeping 0 0  Tired, decreased energy 0 0  Change in appetite 0 0  Feeling bad or failure about yourself  0 0  Trouble concentrating 0 0  Moving slowly or fidgety/restless 0 0  Suicidal thoughts 0 0  PHQ-9 Score 0 0    Contractions: Irritability. Vag. Bleeding: None.  Movement: Present. denies leaking of fluid.  Review of Systems:   Pertinent items are noted in HPI Denies abnormal vaginal discharge w/ itching/odor/irritation, headaches, visual changes, shortness of breath, chest pain, abdominal pain, severe nausea/vomiting, or problems with urination or bowel movements unless otherwise stated above. Pertinent History Reviewed:  Reviewed past medical,surgical, social, obstetrical and family history.  Reviewed problem list, medications and allergies. Physical Assessment:   Vitals:   02/13/21 1554  BP: 133/87  Pulse: (!) 118  Weight: 147 lb (66.7 kg)  Body mass index is 24.46 kg/m.           Physical Examination:   General appearance: alert, well appearing, and in no distress  Mental status: alert, oriented to person, place, and time  Skin: warm & dry   Extremities: Edema: None    Cardiovascular: normal heart rate noted  Respiratory: normal respiratory effort, no distress  Abdomen: gravid, soft, non-tender  Pelvic: Cervical exam deferred          Fetal Status:     Movement: Present    Fetal Surveillance Testing today: BPP 8/8 with normal Doppler flow studies   Chaperone: N/A    Results for orders placed or performed in visit on 02/13/21 (from the past 24 hour(s))  POC Urinalysis Dipstick OB   Collection Time: 02/13/21  3:16 PM  Result Value Ref Range   Color, UA     Clarity, UA     Glucose, UA Negative Negative   Bilirubin, UA     Ketones, UA neg    Spec Grav, UA     Blood, UA neg    pH, UA     POC,PROTEIN,UA Negative Negative, Trace, Small (1+), Moderate (2+), Large (3+), 4+   Urobilinogen, UA     Nitrite, UA neg    Leukocytes, UA Negative Negative   Appearance     Odor      Assessment & Plan:  High-risk pregnancy: G1P0000 at [redacted]w[redacted]d with an Estimated Date of Delivery: 03/05/21   1) GHTN, stable, no meds, no symptoms BP are still normal  2) FGR, stable  Meds: No orders of the defined types were placed in this encounter.   Labs/procedures today: U/S  Treatment Plan:  IOL 02/19/21 scheduled today  Reviewed: Term labor symptoms and general obstetric precautions including but not limited to vaginal bleeding, contractions, leaking of fluid and fetal movement were reviewed in detail with the patient.  All questions were answered. Does have home bp cuff. Office bp cuff given: not applicable. Check bp  daily, let us know if consistently >140 and/or >90.  Follow-up: No follow-ups on file.   Future Appointments  Date Time Provider Department Center  02/17/2021  3:30 PM [facility] NURSE [facility]  02/19/2021  9:45 AM [facility] SCHED ROOM [facility] None    Orders Placed This Encounter  Procedures  . POC Urinalysis Dipstick OB   Lazaro Arms CNM, [facility] 02/13/2021 4:34 PM

## 2021-02-13 NOTE — Progress Notes (Signed)
[place] 37+1 wks,cephalic,BPP 8/8,posterior placenta gr 2,fhr 138 BPM,AFI 18 cm,RI .64,.61,.58,.59=65%

## 2021-02-14 ENCOUNTER — Encounter (HOSPITAL_COMMUNITY): Payer: Self-pay | Admitting: *Deleted

## 2021-02-14 ENCOUNTER — Telehealth (HOSPITAL_COMMUNITY): Payer: Self-pay | Admitting: *Deleted

## 2021-02-14 NOTE — Telephone Encounter (Signed)
Preadmission screen  

## 2021-02-17 ENCOUNTER — Other Ambulatory Visit (HOSPITAL_COMMUNITY): Payer: Medicaid Other

## 2021-02-17 ENCOUNTER — Other Ambulatory Visit: Payer: Self-pay

## 2021-02-17 ENCOUNTER — Ambulatory Visit (INDEPENDENT_AMBULATORY_CARE_PROVIDER_SITE_OTHER): Payer: Medicaid Other | Admitting: *Deleted

## 2021-02-17 ENCOUNTER — Other Ambulatory Visit: Payer: Self-pay | Admitting: Advanced Practice Midwife

## 2021-02-17 DIAGNOSIS — O133 Gestational [pregnancy-induced] hypertension without significant proteinuria, third trimester: Secondary | ICD-10-CM

## 2021-02-17 DIAGNOSIS — O0993 Supervision of high risk pregnancy, unspecified, third trimester: Secondary | ICD-10-CM | POA: Diagnosis not present

## 2021-02-17 DIAGNOSIS — R030 Elevated blood-pressure reading, without diagnosis of hypertension: Secondary | ICD-10-CM

## 2021-02-17 NOTE — Progress Notes (Addendum)
   NURSE VISIT- NST  SUBJECTIVE:  Laura Mayo is a 20 y.o. G1P0000 female at [redacted]w[redacted]d, here for a NST for pregnancy complicated by [facility].  She reports active fetal movement, contractions: none, vaginal bleeding: none, membranes: intact.   OBJECTIVE:  BP 128/82   Pulse (!) 121   Wt 147 lb 4.8 oz (66.8 kg)   LMP 05/29/2020 (Approximate)   BMI 24.51 kg/m   Appears well, no apparent distress  No results found for this or any previous visit (from the past 24 hour(s)).  NST: FHR baseline 144 bpm, Variability: moderate, Accelerations:present, Decelerations:  Absent= Cat 1/reactive Toco: none   ASSESSMENT: G1P0000 at [redacted]w[redacted]d with GHTN NST reactive  PLAN: EFM strip reviewed by Dr. Despina Hidden   Recommendations: keep next appointment as scheduled    Annamarie Dawley  02/17/2021 4:26 PM   Attestation of Attending Supervision of Nursing Visit Encounter: Evaluation and management procedures were performed by the nursing staff under my supervision and collaboration.  I have reviewed the nurse's note and chart, and I agree with the management and plan.  Rockne Coons MD Attending Physician for the Center for [facility] 02/18/2021 9:01 AM

## 2021-02-18 NOTE — Addendum Note (Signed)
Addended by: Lazaro Arms on: 02/18/2021 09:01 AM   Modules accepted: Level of Service

## 2021-02-19 ENCOUNTER — Inpatient Hospital Stay (HOSPITAL_COMMUNITY): Payer: Medicaid Other

## 2021-02-19 ENCOUNTER — Inpatient Hospital Stay (HOSPITAL_COMMUNITY): Payer: Medicaid Other | Admitting: Anesthesiology

## 2021-02-19 ENCOUNTER — Encounter (HOSPITAL_COMMUNITY): Payer: Self-pay | Admitting: Obstetrics and Gynecology

## 2021-02-19 ENCOUNTER — Inpatient Hospital Stay (HOSPITAL_COMMUNITY)
Admission: AD | Admit: 2021-02-19 | Discharge: 2021-02-21 | DRG: 807 | Disposition: A | Discharge status: Home / Self Care | Payer: Medicaid Other | Attending: Obstetrics and Gynecology | Admitting: Obstetrics and Gynecology

## 2021-02-19 ENCOUNTER — Other Ambulatory Visit: Payer: Self-pay

## 2021-02-19 DIAGNOSIS — O26893 Other specified pregnancy related conditions, third trimester: Secondary | ICD-10-CM | POA: Diagnosis present

## 2021-02-19 DIAGNOSIS — O0993 Supervision of high risk pregnancy, unspecified, third trimester: Secondary | ICD-10-CM

## 2021-02-19 DIAGNOSIS — O365930 Maternal care for other known or suspected poor fetal growth, third trimester, not applicable or unspecified: Secondary | ICD-10-CM

## 2021-02-19 DIAGNOSIS — O134 Gestational [pregnancy-induced] hypertension without significant proteinuria, complicating childbirth: Secondary | ICD-10-CM

## 2021-02-19 DIAGNOSIS — Z3A38 38 weeks gestation of pregnancy: Secondary | ICD-10-CM

## 2021-02-19 DIAGNOSIS — R030 Elevated blood-pressure reading, without diagnosis of hypertension: Secondary | ICD-10-CM

## 2021-02-19 DIAGNOSIS — Z30017 Encounter for initial prescription of implantable subdermal contraceptive: Secondary | ICD-10-CM

## 2021-02-19 DIAGNOSIS — O3403 Maternal care for unspecified congenital malformation of uterus, third trimester: Secondary | ICD-10-CM | POA: Diagnosis present

## 2021-02-19 DIAGNOSIS — Q513 Bicornate uterus: Secondary | ICD-10-CM

## 2021-02-19 DIAGNOSIS — O365990 Maternal care for other known or suspected poor fetal growth, unspecified trimester, not applicable or unspecified: Secondary | ICD-10-CM | POA: Diagnosis present

## 2021-02-19 DIAGNOSIS — Z20822 Contact with and (suspected) exposure to covid-19: Secondary | ICD-10-CM | POA: Diagnosis present

## 2021-02-19 DIAGNOSIS — O139 Gestational [pregnancy-induced] hypertension without significant proteinuria, unspecified trimester: Secondary | ICD-10-CM | POA: Diagnosis present

## 2021-02-19 DIAGNOSIS — Z87891 Personal history of nicotine dependence: Secondary | ICD-10-CM

## 2021-02-19 DIAGNOSIS — O133 Gestational [pregnancy-induced] hypertension without significant proteinuria, third trimester: Secondary | ICD-10-CM

## 2021-02-19 LAB — TYPE AND SCREEN
ABO/RH(D): A POS
Antibody Screen: NEGATIVE

## 2021-02-19 LAB — RESP PANEL BY RT-PCR (FLU A&B, COVID) ARPGX2
Influenza A by PCR: NEGATIVE
Influenza B by PCR: NEGATIVE
SARS Coronavirus 2 by RT PCR: NEGATIVE

## 2021-02-19 LAB — COMPREHENSIVE METABOLIC PANEL
ALT: 12 U/L (ref 0–44)
AST: 18 U/L (ref 15–41)
Albumin: 3 g/dL — ABNORMAL LOW (ref 3.5–5.0)
Alkaline Phosphatase: 88 U/L (ref 38–126)
Anion gap: 7 (ref 5–15)
BUN: 8 mg/dL (ref 6–20)
CO2: 22 mmol/L (ref 22–32)
Calcium: 8.7 mg/dL — ABNORMAL LOW (ref 8.9–10.3)
Chloride: 104 mmol/L (ref 98–111)
Creatinine, Ser: 0.62 mg/dL (ref 0.44–1.00)
GFR, Estimated: >60 mL/min (ref >60)
Glucose, Bld: 85 mg/dL (ref 70–99)
Potassium: 3.4 mmol/L — ABNORMAL LOW (ref 3.5–5.1)
Sodium: 133 mmol/L — ABNORMAL LOW (ref 135–145)
Total Bilirubin: 0.5 mg/dL (ref 0.3–1.2)
Total Protein: 6.4 g/dL — ABNORMAL LOW (ref 6.5–8.1)

## 2021-02-19 LAB — CBC
HCT: 34.8 % — ABNORMAL LOW (ref 36.0–46.0)
Hemoglobin: 11.6 g/dL — ABNORMAL LOW (ref 12.0–15.0)
MCH: 29.9 pg (ref 26.0–34.0)
MCHC: 33.3 g/dL (ref 30.0–36.0)
MCV: 89.7 fL (ref 80.0–100.0)
Platelets: 239 10*3/uL (ref 150–400)
RBC: 3.88 MIL/uL (ref 3.87–5.11)
RDW: 12.8 % (ref 11.5–15.5)
WBC: 9.9 10*3/uL (ref 4.0–10.5)
nRBC: 0 % (ref 0.0–0.2)

## 2021-02-19 LAB — PROTEIN / CREATININE RATIO, URINE
Creatinine, Urine: 62.9 mg/dL
Total Protein, Urine: <6 mg/dL

## 2021-02-19 MED ORDER — FENTANYL-BUPIVACAINE-NACL 0.5-0.125-0.9 MG/250ML-% EP SOLN
12.0000 mL/h | EPIDURAL | Status: DC | PRN
  Filled 2021-02-19: qty 250

## 2021-02-19 MED ORDER — OXYCODONE-ACETAMINOPHEN 5-325 MG PO TABS: 1.0000 | ORAL_TABLET | ORAL | Status: DC | PRN

## 2021-02-19 MED ORDER — LACTATED RINGERS IV SOLN
500.0000 mL | Freq: Once | INTRAVENOUS | Status: AC
  Administered 2021-02-19: 500 mL via INTRAVENOUS

## 2021-02-19 MED ORDER — EPHEDRINE 5 MG/ML INJ: 10.0000 mg | INTRAVENOUS | Status: DC | PRN

## 2021-02-19 MED ORDER — HYDRALAZINE HCL 20 MG/ML IJ SOLN: 10.0000 mg | INTRAMUSCULAR | Status: DC | PRN

## 2021-02-19 MED ORDER — LABETALOL HCL 5 MG/ML IV SOLN
40.0000 mg | INTRAVENOUS | Status: DC | PRN
Start: 2021-02-19 — End: 2021-02-19

## 2021-02-19 MED ORDER — PHENYLEPHRINE 40 MCG/ML (10ML) SYRINGE FOR IV PUSH (FOR BLOOD PRESSURE SUPPORT): 80.0000 ug | PREFILLED_SYRINGE | INTRAVENOUS | Status: DC | PRN

## 2021-02-19 MED ORDER — TERBUTALINE SULFATE 1 MG/ML IJ SOLN: 0.2500 mg | Freq: Once | INTRAMUSCULAR | Status: DC | PRN

## 2021-02-19 MED ORDER — OXYCODONE-ACETAMINOPHEN 5-325 MG PO TABS
2.0000 | ORAL_TABLET | ORAL | Status: DC | PRN
Start: 2021-02-19 — End: 2021-02-20

## 2021-02-19 MED ORDER — LACTATED RINGERS IV SOLN: INTRAVENOUS | Status: DC

## 2021-02-19 MED ORDER — LABETALOL HCL 5 MG/ML IV SOLN: 20.0000 mg | INTRAVENOUS | Status: DC | PRN

## 2021-02-19 MED ORDER — OXYTOCIN-SODIUM CHLORIDE 30-0.9 UT/500ML-% IV SOLN
2.5000 [IU]/h | INTRAVENOUS | Status: DC
  Administered 2021-02-19: 2.5 [IU]/h via INTRAVENOUS

## 2021-02-19 MED ORDER — FENTANYL-BUPIVACAINE-NACL 0.5-0.125-0.9 MG/250ML-% EP SOLN
EPIDURAL | Status: DC | PRN
  Administered 2021-02-19: 12 mL/h via EPIDURAL

## 2021-02-19 MED ORDER — LIDOCAINE HCL (PF) 1 % IJ SOLN
INTRAMUSCULAR | Status: DC | PRN
  Administered 2021-02-19: 6 mL via EPIDURAL

## 2021-02-19 MED ORDER — DIPHENHYDRAMINE HCL 50 MG/ML IJ SOLN: 12.5000 mg | INTRAMUSCULAR | Status: DC | PRN

## 2021-02-19 MED ORDER — LACTATED RINGERS IV SOLN: 500.0000 mL | INTRAVENOUS | Status: DC | PRN

## 2021-02-19 MED ORDER — SOD CITRATE-CITRIC ACID 500-334 MG/5ML PO SOLN: 30.0000 mL | ORAL | Status: DC | PRN

## 2021-02-19 MED ORDER — ONDANSETRON HCL 4 MG/2ML IJ SOLN
4.0000 mg | Freq: Four times a day (QID) | INTRAMUSCULAR | Status: DC | PRN
  Administered 2021-02-19: 4 mg via INTRAVENOUS
  Filled 2021-02-19: qty 2

## 2021-02-19 MED ORDER — ACETAMINOPHEN 325 MG PO TABS: 650.0000 mg | ORAL_TABLET | ORAL | Status: DC | PRN

## 2021-02-19 MED ORDER — LIDOCAINE HCL (PF) 1 % IJ SOLN: 30.0000 mL | INTRAMUSCULAR | Status: DC | PRN

## 2021-02-19 MED ORDER — FENTANYL CITRATE (PF) 100 MCG/2ML IJ SOLN
50.0000 ug | INTRAMUSCULAR | Status: DC | PRN
  Administered 2021-02-19: 100 ug via INTRAVENOUS
  Filled 2021-02-19: qty 2

## 2021-02-19 MED ORDER — MAGNESIUM SULFATE 40 GM/1000ML IV SOLN
2.0000 g/h | INTRAVENOUS | Status: DC
Start: 2021-02-19 — End: 2021-02-19

## 2021-02-19 MED ORDER — LABETALOL HCL 5 MG/ML IV SOLN: 80.0000 mg | INTRAVENOUS | Status: DC | PRN

## 2021-02-19 MED ORDER — OXYTOCIN BOLUS FROM INFUSION
333.0000 mL | Freq: Once | INTRAVENOUS | Status: AC
  Administered 2021-02-19: 333 mL via INTRAVENOUS

## 2021-02-19 MED ORDER — MAGNESIUM SULFATE BOLUS VIA INFUSION: 4.0000 g | Freq: Once | INTRAVENOUS | Status: DC

## 2021-02-19 MED ORDER — OXYTOCIN-SODIUM CHLORIDE 30-0.9 UT/500ML-% IV SOLN
1.0000 m[IU]/min | INTRAVENOUS | Status: DC
Start: 2021-02-19 — End: 2021-02-20
  Administered 2021-02-19: 2 m[IU]/min via INTRAVENOUS
  Filled 2021-02-19: qty 500

## 2021-02-19 NOTE — Discharge Instructions (Signed)

## 2021-02-19 NOTE — Discharge Summary (Addendum)
Postpartum Discharge Summary     Patient Name: Laura Mayo DOB: 2001-05-07 MRN: [id number]  Date of admission: 02/19/2021 Delivery date:02/19/2021  Delivering provider: Serita Grammes D  Date of discharge: 02/21/2021  Admitting diagnosis: Gestational hypertension [O13.9] Intrauterine pregnancy: [redacted]w[redacted]d    Secondary diagnosis:  Active Problems:   Bicornate uterus   IUGR (intrauterine growth restriction) affecting care of mother   Gestational hypertension  Additional problems: none    Discharge diagnosis: Term Pregnancy Delivered and Gestational Hypertension                                              Post partum procedures: none Augmentation: AROM, Pitocin and IP Foley Complications: None  Hospital course: Induction of Labor With Vaginal Delivery   20y.o. yo G1P0000 at 31w0das admitted to the hospital 02/19/2021 for induction of labor.  Indication for induction:  FGR (5.8% @ 34.6wks) & gHTN (dx @ 34wks) .  Patient had an uncomplicated labor course, with an initially semi-favorable cervix allowing for cervical foley placement and Pit, and progressing to vag del later that same day. She was asymptomatic with neg pre-e labs and mild range BP elevations. Membrane Rupture Time/Date: 5:42 PM ,02/19/2021   Delivery Method:Vaginal, Spontaneous  Episiotomy: None  Lacerations:  None  Details of delivery can be found in separate delivery note.  Patient had a routine postpartum course with BPs WNL. Patient is discharged home 02/21/21.  Newborn Data: Birth date:02/19/2021  Birth time:10:22 PM  Gender:Female  Living status:Living  Apgars:9 ,9  Weight:2240 g (4lb 150z)  Magnesium Sulfate received: No BMZ received: No Rhophylac:N/A MMR:N/A  T-DaP:Given prenatally Flu: No Transfusion:No  Physical exam  Vitals:   02/20/21 1535 02/20/21 2030 02/21/21 0524 02/21/21 0900  BP: 120/61 (!) 123/94 (!) 108/91 115/81  Pulse: 68 72 79 81  Resp: _0 Temp:  98.1 F (36.7 C) 98 F (36.7 C)  98 F (36.7 C)  TempSrc:  Oral Oral Oral  SpO2: 99% 99% 98% 98%  Weight:      Height:       General: alert, cooperative and no distress Lochia: appropriate Uterine Fundus: firm Incision: N/A DVT Evaluation: No evidence of DVT seen on physical exam. Negative Homan's sign. No cords or calf tenderness. No significant calf/ankle edema. Labs: Lab Results  Component Value Date   WBC 9.9 02/19/2021   HGB 11.6 (L) 02/19/2021   HCT 34.8 (L) 02/19/2021   MCV 89.7 02/19/2021   PLT 239 02/19/2021   CMP Latest Ref Rng & Units 02/19/2021  Glucose 70 - 99 mg/dL 85  BUN 6 - 20 mg/dL 8  Creatinine 0.44 - 1.00 mg/dL 0.62  Sodium 135 - 145 mmol/L 133(L)  Potassium 3.5 - 5.1 mmol/L 3.4(L)  Chloride 98 - 111 mmol/L 104  CO2 22 - 32 mmol/L 22  Calcium 8.9 - 10.3 mg/dL 8.7(L)  Total Protein 6.5 - 8.1 g/dL 6.4(L)  Total Bilirubin 0.3 - 1.2 mg/dL 0.5  Alkaline Phos 38 - 126 U/L 88  AST 15 - 41 U/L 18  ALT 0 - 44 U/L 12   Edinburgh Score: Edinburgh Postnatal Depression Scale Screening Tool 02/21/2021  I have been able to laugh and see the funny side of things. 0  I have looked forward with enjoyment to things. 0  I have blamed myself unnecessarily when things went  wrong. 0  I have been anxious or worried for no good reason. 0  I have felt scared or panicky for no good reason. 0  Things have been getting on top of me. 0  I have been so unhappy that I have had difficulty sleeping. 0  I have felt sad or miserable. 0  I have been so unhappy that I have been crying. 0  The thought of harming myself has occurred to me. 0  Edinburgh Postnatal Depression Scale Total 0     After visit meds:  Allergies as of 02/21/2021   No Known Allergies      Medication List     STOP taking these medications    Blood Pressure Monitor Misc       TAKE these medications    ibuprofen 600 MG tablet Commonly known as: ADVIL Take 1 tablet (600 mg total) by mouth every 6 (six) hours.   PRENATAL VITAMIN  PO Take by mouth.         Discharge home in stable condition Infant Feeding: Breast Infant Disposition:home with mother Discharge instruction: per After Visit Summary and Postpartum booklet. Activity: Advance as tolerated. Pelvic rest for 6 weeks.  Diet: routine diet Future Appointments: Future Appointments  Date Time Provider [place]  02/26/2021 10:50 AM [facility] NURSE [facility]  03/26/2021 11:10 AM Roma Schanz, CNM [facility]   Follow up Visit:  Follow-up Information     [facility] Follow up on 02/26/2021.   Specialty: Obstetrics and Gynecology Why: for blood pressure check (can be virtual if you have a blood pressure cuff at home) Contact information: [address] [place], Stark, CNM  Gloris [place] Please schedule this patient for Postpartum visit in: 4 weeks with the following provider: Any provider  In-Person  For C/S patients schedule nurse incision check in weeks 2 weeks: no  High risk pregnancy complicated by: gHTN, FGR  Delivery mode:  SVD  Anticipated Birth Control:Nexplanon placed PP Procedures needed: BP check in 1wk  Schedule Integrated [place] visit: no   02/21/2021 Delora Fuel, MD  I personally saw and evaluated the patient, performing the key elements of the service. I developed and verified the management plan that is described in the resident's/student's note, and I agree with the content with my edits above. VSS, HRR&R, Resp unlabored, Legs neg.  Nigel Berthold, CNM 02/26/2021 9:28 AM

## 2021-02-19 NOTE — Anesthesia Preprocedure Evaluation (Signed)
Anesthesia Evaluation  Patient identified by MRN, date of birth, ID band Patient awake    Reviewed: Allergy & Precautions, H&P , NPO status , Patient's Chart, lab work & pertinent test results, reviewed documented beta blocker date and time   Airway Mallampati: II  TM Distance: >3 FB Neck ROM: full    Dental no notable dental hx. (+) Teeth Intact, Dental Advisory Given   Pulmonary neg pulmonary ROS, former smoker,    Pulmonary exam normal breath sounds clear to auscultation       Cardiovascular hypertension, negative cardio ROS Normal cardiovascular exam Rhythm:regular Rate:Normal     Neuro/Psych negative neurological ROS  negative psych ROS   GI/Hepatic negative GI ROS, Neg liver ROS,   Endo/Other  negative endocrine ROS  Renal/GU negative Renal ROS  negative genitourinary   Musculoskeletal   Abdominal   Peds  Hematology negative hematology ROS (+)   Anesthesia Other Findings   Reproductive/Obstetrics (+) Pregnancy                             Anesthesia Physical Anesthesia Plan  ASA: II  Anesthesia Plan: Epidural   Post-op Pain Management:    Induction:   PONV Risk Score and Plan: 2  Airway Management Planned: Natural Airway  Additional Equipment:   Intra-op Plan:   Post-operative Plan:   Informed Consent: I have reviewed the patients History and Physical, chart, labs and discussed the procedure including the risks, benefits and alternatives for the proposed anesthesia with the patient or authorized representative who has indicated his/her understanding and acceptance.       Plan Discussed with: Anesthesiologist and CRNA  Anesthesia Plan Comments:         Anesthesia Quick Evaluation

## 2021-02-19 NOTE — Progress Notes (Signed)
Per Dr. Barb Merino, patient may have a light laboring diet and after pt is done eating, start Pitocin per order.

## 2021-02-19 NOTE — Progress Notes (Signed)
Patient ID: Laura Mayo, female   DOB: [birth year], 20 y.o.   MRN: [id number]  Comfortable w epidural  BP 101/56, P 80 FHR 120-130s, +accels, no decels Ctx q 2-3 mins with Pit at 35mu/min Cx at last check 1742: 6+/90/vtx +1   IUP@38 .0wks FGR gHTN Early active labor  -Plan to check cx within the hour -Anticipate vag del  Arabella Merles CNM 02/19/2021

## 2021-02-19 NOTE — Progress Notes (Signed)
Labor Progress Note Laura Mayo is a 20 y.o. G1P0000 at [redacted]w[redacted]d presented for IOL secondary to IUGR and gHTN.  S: Pt doing well. Considering epidural at this time. S/p IV fentanyl x1.  O:  BP 116/83   Pulse 98   Temp 98.2 F (36.8 C) (Oral)   Resp 18   Ht 5\' 5"  (1.651 m)   Wt 66.8 kg   LMP 05/29/2020 (Approximate)   BMI 24.51 kg/m  EFM: baseline 125/moderate variability/+accels/no decels Toco: ctx every 2-4 min  CVE: Dilation: 6 Effacement (%): 90 Station: -1 Presentation: Vertex Exam by:: [place], rn   A&P: 20 y.o. G1P0000 [redacted]w[redacted]d presented for IOL secondary to IUGR and gHTN. #Labor: Progressing well. S/p FB (out at 1409). Continued on pitocin since 1230; will continue to up-titrate as clinically indicated. Will consider AROM s/p epidural. #Pain: s/p IV fentanyl x1; pt now considering epidural #FWB: Category 1 strip #GBS negative #gHTN: pt continues to have normal range blood pressures. Normal preeclampsia labs on admission. Will continue to monitor.  1231, MD 2:27 PM

## 2021-02-19 NOTE — Anesthesia Procedure Notes (Signed)
Epidural Patient location during procedure: OB Start time: 02/19/2021 4:40 PM End time: 02/19/2021 4:46 PM  Staffing Anesthesiologist: Bethena Midget, MD  Preanesthetic Checklist Completed: patient identified, IV checked, site marked, risks and benefits discussed, surgical consent, monitors and equipment checked, pre-op evaluation and timeout performed  Epidural Patient position: sitting Prep: DuraPrep and site prepped and draped Patient monitoring: continuous pulse ox and blood pressure Approach: midline Location: L3-L4 Injection technique: LOR air  Needle:  Needle type: Tuohy  Needle gauge: 17 G Needle length: 9 cm and 9 Needle insertion depth: 5 cm cm Catheter type: closed end flexible Catheter size: 19 Gauge Catheter at skin depth: 10 cm Test dose: negative  Assessment Events: blood not aspirated, injection not painful, no injection resistance, no paresthesia and negative IV test

## 2021-02-19 NOTE — Progress Notes (Signed)
Labor Progress Note Laura Mayo is a 20 y.o. G1P0000 at [redacted]w[redacted]d presented for IOL secondary to IUGR and gHTN.  S: Pt doing well. Comfortable with epidural in place.  O:  BP 124/78   Pulse 69   Temp 97.7 F (36.5 C) (Oral)   Resp 16   Ht 5\' 5"  (1.651 m)   Wt 66.8 kg   LMP 05/29/2020 (Approximate)   SpO2 97%   BMI 24.51 kg/m  EFM: baseline 120/moderate variability/+accels/no decels Toco: ctx every 3-4 min  CVE: Dilation: 6.5 Effacement (%): 90 Station: Plus 1 Presentation: Vertex Exam by:: Dr. [email or web address]   A&P: 20 y.o. G1P0000 [redacted]w[redacted]d presented for IOL secondary to IUGR and gHTN. #Labor: Progressing well. S/p FB (out at 1409). Continued on pitocin since 1230. S/p AROM at 1740. Will continue to up-titrate pitocin as clinically indicated.  #Pain: epidural in place #FWB: Category 1 strip #GBS negative #gHTN: pt continues to have normal range blood pressures. Normal preeclampsia labs on admission. Will continue to monitor.  1231, MD 7:12 PM

## 2021-02-19 NOTE — H&P (Signed)
OBSTETRIC ADMISSION HISTORY AND PHYSICAL  Laura Mayo is a 20 y.o. female G1P0000 with IUP at [redacted]w[redacted]d by L/7 presenting for. She reports +FMs, No LOF, no VB, no blurry vision, headaches or peripheral edema, and RUQ pain. She plans on breast feeding. She is undecided for birth control.  She received her prenatal care at [facility]   Dating: By L/7 --->  Estimated Date of Delivery: 03/05/21  Sono:  @[redacted]w[redacted]d , CWD, normal anatomy, cephalic presentation,  2028g, 5.8% EFW, normal Dopplers  Prenatal History/Complications:  - gHTN - Bicornate uterus - IUGR  Past Medical History: Past Medical History:  Diagnosis Date  . Medical history non-contributory   . Pregnancy induced hypertension     Past Surgical History: Past Surgical History:  Procedure Laterality Date  . NO PAST SURGERIES      Obstetrical History: OB History    Gravida  1   Para  0   Term  0   Preterm  0   AB  0   Living  0     SAB  0   IAB  0   Ectopic  0   Multiple  0   Live Births  0           Social History Social History   Socioeconomic History  . Marital status: Single    Spouse name: Not on file  . Number of children: Not on file  . Years of education: Not on file  . Highest education level: Not on file  Occupational History  . Not on file  Tobacco Use  . Smoking status: Former Smoker    Packs/day: 0.50    Types: Cigarettes  . Smokeless tobacco: Never Used  Vaping Use  . Vaping Use: Never used  Substance and Sexual Activity  . Alcohol use: No  . Drug use: No  . Sexual activity: Yes    Birth control/protection: None  Other Topics Concern  . Not on file  Social History Narrative  . Not on file   Social Determinants of Health   Financial Resource Strain: Low Risk   . Difficulty of Paying Living Expenses: Not very hard  Food Insecurity: No Food Insecurity  . Worried About 02-22-1998 in the Last Year: Never true  . Ran Out of Food in the Last Year: Never true   Transportation Needs: No Transportation Needs  . Lack of Transportation (Medical): No  . Lack of Transportation (Non-Medical): No  Physical Activity: Insufficiently Active  . Days of Exercise per Week: 2 days  . Minutes of Exercise per Session: 20 min  Stress: No Stress Concern Present  . Feeling of Stress : Not at all  Social Connections: Moderately Isolated  . Frequency of Communication with Friends and Family: Three times a week  . Frequency of Social Gatherings with Friends and Family: Once a week  . Attends Religious Services: Never  . Active Member of Clubs or Organizations: No  . Attends Programme researcher, broadcasting/film/video Meetings: Never  . Marital Status: Living with partner    Family History: Family History  Problem Relation Age of Onset  . Cancer Paternal Grandmother        breast  . Cancer Maternal Grandmother        colon  . Hypertension Maternal Grandfather     Allergies: No Known Allergies  Medications Prior to Admission  Medication Sig Dispense Refill Last Dose  . Blood Pressure Monitor MISC For regular home bp monitoring during pregnancy  1 each 0   . Prenatal Vit-Fe Fumarate-FA (PRENATAL VITAMIN PO) Take by mouth.        Review of Systems   All systems reviewed and negative except as stated in HPI  Height 5\' 5"  (1.651 m), weight 66.8 kg, last menstrual period 05/29/2020. General appearance: alert, cooperative and appears stated age Lungs: normal WOB Heart: regular rate  Abdomen: soft, non-tender Extremities: no sign of DVT  Presentation: cephalic Fetal monitoringBaseline: 140 bpm, Variability: Good {> 6 bpm), Accelerations: Reactive and Decelerations: Absent Uterine activityFrequency: Every 2-4 minutes     Prenatal labs: ABO, Rh: A/Positive/-- (11/08 1500) Antibody: Negative (02/23 0853) Rubella: 2.28 (11/08 1500) RPR: Non Reactive (02/23 0853)  HBsAg: Negative (11/08 1500)  HIV: Non Reactive (02/23 0853)  GBS: Negative/-- (04/21 1348)  2 hr Glucola  wnl Genetic screening wnl Anatomy 07-28[birth year]1996 wnl  Prenatal Transfer Tool  Maternal Diabetes: No Genetic Screening: Normal Maternal Ultrasounds/Referrals: IUGR Fetal Ultrasounds or other Referrals:  None Maternal Substance Abuse:  No Significant Maternal Medications:  None Significant Maternal Lab Results: Group B Strep negative  No results found for this or any previous visit (from the past 24 hour(s)).  Patient Active Problem List   Diagnosis Date Noted  . Gestational hypertension 01/30/2021  . IUGR (intrauterine growth restriction) affecting care of mother 01/28/2021  . Elevated blood-pressure reading, without diagnosis of hypertension 12/25/2020  . 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 Supervision of high-risk pregnancy 08/23/2020    Assessment/Plan:  SEASON ASTACIO is a 20 y.o. G1P0000 at [redacted]w[redacted]d here for IOL secondary to IUGR and gHTN.  #Induction of Labor: Given initial cervical exam, FB placed and pitocin started. #Pain: TBD per pt request #FWB: Category 1 strip #ID: GBS negative #MOF: breast #MOC: undecided #Circ: desires #gHTN: Normal BP on admission. F/u Preeclampsia labs on admission.  [redacted]w[redacted]d, MD OB Fellow, Faculty Practice 02/19/2021 9:02 AM

## 2021-02-20 ENCOUNTER — Encounter (HOSPITAL_COMMUNITY): Payer: Self-pay | Admitting: Obstetrics and Gynecology

## 2021-02-20 DIAGNOSIS — Z30017 Encounter for initial prescription of implantable subdermal contraceptive: Secondary | ICD-10-CM

## 2021-02-20 LAB — RPR: RPR Ser Ql: NONREACTIVE

## 2021-02-20 LAB — BIRTH TISSUE RECOVERY COLLECTION (PLACENTA DONATION)

## 2021-02-20 MED ORDER — OXYCODONE HCL 5 MG PO TABS: 5.0000 mg | ORAL_TABLET | ORAL | Status: DC | PRN

## 2021-02-20 MED ORDER — DIBUCAINE (PERIANAL) 1 % EX OINT: 1.0000 "application " | TOPICAL_OINTMENT | CUTANEOUS | Status: DC | PRN

## 2021-02-20 MED ORDER — ONDANSETRON HCL 4 MG/2ML IJ SOLN: 4.0000 mg | INTRAMUSCULAR | Status: DC | PRN

## 2021-02-20 MED ORDER — TETANUS-DIPHTH-ACELL PERTUSSIS 5-2.5-18.5 LF-MCG/0.5 IM SUSY: 0.5000 mL | PREFILLED_SYRINGE | Freq: Once | INTRAMUSCULAR | Status: DC

## 2021-02-20 MED ORDER — DIPHENHYDRAMINE HCL 25 MG PO CAPS: 25.0000 mg | ORAL_CAPSULE | Freq: Four times a day (QID) | ORAL | Status: DC | PRN

## 2021-02-20 MED ORDER — COCONUT OIL OIL: 1.0000 "application " | TOPICAL_OIL | Status: DC | PRN

## 2021-02-20 MED ORDER — ZOLPIDEM TARTRATE 5 MG PO TABS: 5.0000 mg | ORAL_TABLET | Freq: Every evening | ORAL | Status: DC | PRN

## 2021-02-20 MED ORDER — SENNOSIDES-DOCUSATE SODIUM 8.6-50 MG PO TABS
2.0000 | ORAL_TABLET | ORAL | Status: DC
  Administered 2021-02-21: 2 via ORAL
  Filled 2021-02-20 (×2): qty 2

## 2021-02-20 MED ORDER — WITCH HAZEL-GLYCERIN EX PADS: 1.0000 "application " | MEDICATED_PAD | CUTANEOUS | Status: DC | PRN

## 2021-02-20 MED ORDER — ACETAMINOPHEN 325 MG PO TABS: 650.0000 mg | ORAL_TABLET | ORAL | Status: DC | PRN

## 2021-02-20 MED ORDER — ONDANSETRON HCL 4 MG PO TABS
4.0000 mg | ORAL_TABLET | ORAL | Status: DC | PRN
Start: 2021-02-19 — End: 2021-02-21

## 2021-02-20 MED ORDER — SIMETHICONE 80 MG PO CHEW: 80.0000 mg | CHEWABLE_TABLET | ORAL | Status: DC | PRN

## 2021-02-20 MED ORDER — LIDOCAINE HCL 1 % IJ SOLN
0.0000 mL | Freq: Once | INTRAMUSCULAR | Status: AC | PRN
  Administered 2021-02-20: 3 mL via INTRADERMAL
  Filled 2021-02-20: qty 20

## 2021-02-20 MED ORDER — IBUPROFEN 600 MG PO TABS
600.0000 mg | ORAL_TABLET | Freq: Four times a day (QID) | ORAL | Status: DC
  Administered 2021-02-20 – 2021-02-21 (×3): 600 mg via ORAL
  Filled 2021-02-20 (×5): qty 1

## 2021-02-20 MED ORDER — BENZOCAINE-MENTHOL 20-0.5 % EX AERO
1.0000 "application " | INHALATION_SPRAY | CUTANEOUS | Status: DC | PRN
  Filled 2021-02-20: qty 56

## 2021-02-20 MED ORDER — PRENATAL MULTIVITAMIN CH
1.0000 | ORAL_TABLET | Freq: Every day | ORAL | Status: DC
  Administered 2021-02-20 – 2021-02-21 (×2): 1 via ORAL
  Filled 2021-02-20 (×2): qty 1

## 2021-02-20 MED ORDER — ETONOGESTREL 68 MG ~~LOC~~ IMPL
68.0000 mg | DRUG_IMPLANT | Freq: Once | SUBCUTANEOUS | Status: AC
  Administered 2021-02-20: 68 mg via SUBCUTANEOUS
  Filled 2021-02-20: qty 1

## 2021-02-20 MED ORDER — MEASLES, MUMPS & RUBELLA VAC IJ SOLR: 0.5000 mL | Freq: Once | INTRAMUSCULAR | Status: DC

## 2021-02-20 NOTE — Procedures (Signed)
Post-Placental Nexplanon Insertion Procedure Note  Patient was identified. Informed consent was signed, signed copy in chart. A time-out was performed.    The insertion site was identified 8-10 cm (3-4 inches) from the medial epicondyle of the humerus and 3-5 cm (1.25-2 inches) posterior to (below) the sulcus (groove) between the biceps and triceps muscles of the patient's left arm and marked. The site was prepped and draped in the usual sterile fashion. Pt was prepped with alcohol swab and then injected with 3 cc of 1% lidocaine.  The site was prepped with betadine. Nexplanon removed form packaging,  Device confirmed in needle, then inserted full length of needle and withdrawn per handbook instructions. Provider and patient verified presence of the implant in the woman's arm by palpation. Pt insertion site was covered with steristrips/adhesive bandage and pressure bandage. There was minimal blood loss. Patient tolerated procedure well.  Patient was given post procedure instructions and Nexplanon user card with expiration date. Condoms were recommended for STI prevention. Patient was asked to keep the pressure dressing on for 24 hours to minimize bruising and keep the adhesive bandage on for 3-5 days. The patient verbalized understanding of the plan of care and agrees.   Lot # [id number] Expiration Date: 2024 Jun 29  Sheila Oats, MD [facility] Fellow, Faculty Practice 02/20/2021 12:21 PM

## 2021-02-20 NOTE — Lactation Note (Signed)
This note was copied from a baby's chart. Lactation Consultation Note  Patient Name: Boy Jasneet Schobert RTMYT'R Date: 02/20/2021   Age:20 hours P1, infant less than 5 lbs, IUGR and has been transported to NICU from [facility] due hypoglycemia hx repeated low blood sugars. Per mom, she used the DEBP twice and the 22mm flange is a good fit for her.  LC gave parents NICU booklet and discussed with mom to follow NICU infant feeding guidelines. Mom will set alarm and use DEBP every 3 hours for 15 minutes on initial setting and any colostrum expressed she will take to NICU. Mom knows to call [facility] services if she has any  breastfeeding questions or concerns.  Maternal Data    Feeding    LATCH Score                    Lactation Tools Discussed/Used    Interventions    Discharge    Consult Status      Danelle Earthly 02/20/2021, 5:28 PM

## 2021-02-20 NOTE — Lactation Note (Signed)
This note was copied from a baby's chart. Lactation Consultation Note  Patient Name: Boy Katalaya Beel KGMWN'U Date: 02/20/2021   Age:20 hours P1, ETI infant less than 5 lbs. Mom declined LC services in L&D and on MBU tonight. Per MBU ( RN) mom doesn't want help with latching infant at the breast from [facility] services tonight. RN observed latch, per RN  infant is latching well, 1st feeding was 12 minutes and 2nd was 30 minutes.  LC mention to RN Britta Mccreedy) to possible set mom up with DEBP to help stimulate supply and infant may possible need be supplemented with mom's EBM or donor breast milk due to  Infant having IUGR, weighing less than 5 lbs and being ETI infant. LC services will follow up with mom in morning.  Maternal Data    Feeding    LATCH Score Latch: Grasps breast easily, tongue down, lips flanged, rhythmical sucking.  Audible Swallowing: A few with stimulation  Type of Nipple: Everted at rest and after stimulation  Comfort (Breast/Nipple): Soft / non-tender  Hold (Positioning): Assistance needed to correctly position infant at breast and maintain latch.  LATCH Score: 8   Lactation Tools Discussed/Used    Interventions    Discharge    Consult Status      Danelle Earthly 02/20/2021, 2:51 AM

## 2021-02-20 NOTE — Anesthesia Postprocedure Evaluation (Signed)
Anesthesia Post Note  Patient: [name]  Procedure(s) Performed: AN AD HOC LABOR EPIDURAL     Patient location during evaluation: Mother Baby Anesthesia Type: Epidural Level of consciousness: awake and alert Pain management: pain level controlled Vital Signs Assessment: post-procedure vital signs reviewed and stable Respiratory status: spontaneous breathing, nonlabored ventilation and respiratory function stable Cardiovascular status: stable Postop Assessment: no headache, no backache, epidural receding, patient able to bend at knees, no apparent nausea or vomiting, able to ambulate and adequate PO intake Anesthetic complications: no   No complications documented.  Last Vitals:  Vitals:   02/20/21 0203 02/20/21 0654  BP: 106/66 118/88  Pulse: 60 67  Resp: 16 16  Temp: 36.9 C 36.7 C  SpO2: 98% 99%    Last Pain:  Vitals:   02/20/21 0654  TempSrc: Oral  PainSc:    Pain Goal:                   Laban Emperor

## 2021-02-20 NOTE — Lactation Note (Signed)
This note was copied from a baby's chart. Lactation Consultation Note  Patient Name: Boy Abiha Lukehart XBJYN'W Date: 02/20/2021 Reason for consult: Follow-up assessment Age:20 Hours LC was phoned about infants low blood sugars .  Glucose gel had been given twice. Mother had declined LC visit at delivery and declined any assistance. Dr Jena Gauss arrived and spoke to mother about infant blood sugar. Glucose Gel given a third time.  Mother was agreeable to the use of DBM.  LC assist mother with hand expression and attempt to latch infant several times. Infant was not showing any feeding cues . Infant placed STS with mother until nurse Laverta Baltimore RN brought [facility] to room. Mother taught to give bottle. Infant gagging repeatedly .  LC tried to give infant a bottle, but infant only took 4 ml of DBM.    Maternal Data    Feeding Mother's Current Feeding Choice: Breast Milk  LATCH Score                    Lactation Tools Discussed/Used Breast pump type: Double-Electric Breast Pump;Manual Pump Education: Setup, frequency, and cleaning;Milk Storage Reason for Pumping: infants low CBG,and Wt, IUGR Pumping frequency: q 3 hours  Interventions    Discharge    Consult Status Consult Status: Follow-up Date: 02/20/21 Follow-up type: In-patient    Stevan Born [facility] 02/20/2021, 2:45 PM

## 2021-02-20 NOTE — Lactation Note (Addendum)
This note was copied from a baby's chart. Lactation Consultation Note  Patient Name: Laura Mayo TKPTW'S Date: 02/20/2021 Reason for consult: Follow-up assessment Age:20 Hours  LC assist mother with rousing infant and placing to breast . Infant sleeping and showing no feeding cues. Taught mother to do suck training .  Infant has poor suck reflex and still gagging. Infant burped several times. Father phoned nurse to get [facility]. Mother placed infant STS.  Parents were given guidelines for ETI and reviewed supplemental amts.   Assist mother with Pumping her breast. Fit with a # 24 flange and then tried a #21 for better fit. Mother pumped and obtained several large drops which were applied to nipples. Instruct mother in use of the pump and collection, cleaning and storage of expressed breast milk.    Maternal Data    Feeding Mother's Current Feeding Choice: Breast Milk  LATCH Score                    Lactation Tools Discussed/Used    Interventions    Discharge    Consult Status      Michel Bickers 02/20/2021, 3:05 PM

## 2021-02-20 NOTE — Progress Notes (Signed)
Post Partum Day #1 Subjective: no complaints, up ad lib and tolerating PO; breastfeeding going slowly; wants inpatient Nexplanon placed; wants her son circumcised- consented and note put in chart  Objective: Blood pressure 118/88, pulse 67, temperature 98.1 F (36.7 C), temperature source Oral, resp. rate 16, height 5\' 5"  (1.651 m), weight 66.8 kg, last menstrual period 05/29/2020, SpO2 99 %, unknown if currently breastfeeding.  Physical Exam:  General: alert, cooperative and no distress Lochia: appropriate Uterine Fundus: firm DVT Evaluation: No evidence of DVT seen on physical exam.  Recent Labs    02/19/21 0755  HGB 11.6*  HCT 34.8*    Assessment/Plan: Plan for discharge tomorrow  Nexplanon ordered to be placed today   LOS: 1 day   04/21/21 CNM 02/20/2021, 8:57 AM

## 2021-02-21 MED ORDER — IBUPROFEN 600 MG PO TABS: 600.0000 mg | ORAL_TABLET | Freq: Four times a day (QID) | ORAL | 0 refills | Status: DC

## 2021-02-21 NOTE — Progress Notes (Signed)
Patient screened out for psychosocial assessment since none of the following apply: °Psychosocial stressors documented in mother or baby's chart °Gestation less than 32 weeks °Code at delivery  °Infant with anomalies °Please contact the Clinical Social Worker if specific needs arise, by MOB's request, or if MOB scores greater than 9/yes to question 10 on Edinburgh Postpartum Depression Screen. ° °Marten Iles Boyd-Gilyard, MSW, LCSW °Clinical Social Work °[phone] °  °

## 2021-02-22 ENCOUNTER — Ambulatory Visit: Payer: Self-pay

## 2021-02-22 NOTE — Lactation Note (Addendum)
This note was copied from a baby's chart. Lactation Consultation Note  Patient Name: Laura Mayo FMBWG'Y Date: 02/22/2021  NICU Primigravida ET <5 lbs Age:20 hours   LC in to visit with P1 Mom on day of her discharge.  Mom states she has been pumping on initiation setting every 3 hrs, and expressing 5-10 ml.  Mom given cooler bag and storage bottles.  Mom to go to NICU to pick up breastmilk labels explaining the importance of storing each pumping in separate bottle labeled with date and time of expression.   Mom has a hand's free double pump at home.  Talked about the importance of a hospital grade pump and recommended she use the Medela Symphony in the baby's room as much as possible.  Parents plan to go home for tonight, but them plan to stay with baby as much as possible.   Engorgement prevention and treatment reviewed.  Mom denies any pain with pumping. Mom aware of lactation assistance available and how to contact LC. Encouraged STS and offering breast with cues as able to.  LC will f/u in NICU.   [name] 02/22/2021, 7:35 AM

## 2021-02-22 NOTE — Lactation Note (Signed)
This note was copied from a baby's chart. Lactation Consultation Note  Patient Name: Boy Ernesteen Mihalic KAJGO'T Date: 02/22/2021 Reason for consult: Follow-up assessment;Primapara;1st time breastfeeding;NICU baby;Early term 37-38.6wks;Infant < 6lbs Age:20 hours  32 yr old Mom  LC in to visit with Mom.  Baby sucking vigorously on pacifier.   Mom states she pumped 60 ml at home and brought in for baby.  Mom states it was 2 separate pumpings but they were close together.  Instructed Mom to use a separate bottle, labeled date and time for each pumping.      Breasts filling, not engorged.  Areola compressible and nipples erect.  Offered to assist with positioning and latching baby to the breast.   Baby placed STS in football hold on right breast.    Baby needed a little coaxing to open his mouth, Mom expressed milk onto nipple to entice.  Baby able to attain a deep latch, but repeatedly slipped off the nipple after a few sucks and fell asleep.  Baby noted to have a slight recessed jaw.  Baby sleepy and not cueing to latch.   Mom hand expressing milk from her nipple onto baby's mouth, but he didn't root to latch.  Talked about trying a nipple shield, but it wasn't indicated due to sleepiness of baby.  Mom last pumped 2 hrs before.  Elastic band given and helped Mom to tuck baby into band for STS for gavage feeding.  2 holes cut to use as hand's free pumping bra.  Encouraged Mom to pump after she is done with STS.  Mom to use Symphony DEBP in baby's room.  2 bins provided for washing and drying of pump parts, reviewed importance of this.  F/U with lactation 02/23/21 @ 12 noon.   Feeding Nipple Type: Nfant Extra Slow Flow (gold)  LATCH Score Latch: Repeated attempts needed to sustain latch, nipple held in mouth throughout feeding, stimulation needed to elicit sucking reflex.  Audible Swallowing: None  Type of Nipple: Everted at rest and after stimulation  Comfort (Breast/Nipple): Soft /  non-tender  Hold (Positioning): Assistance needed to correctly position infant at breast and maintain latch.  LATCH Score: 6   Lactation Tools Discussed/Used Tools: Pump Breast pump type: Double-Electric Breast Pump Pumping frequency: Q 3h Pumped volume: 60 mL  Interventions Interventions: Breast feeding basics reviewed;Assisted with latch;Skin to skin;Breast massage;Hand express;Adjust position;Support pillows;Position options;Expressed milk;DEBP;Education  Consult Status Consult Status: Follow-up Date: 02/23/21 Follow-up type: In-patient    [name] 02/22/2021, 3:32 PM

## 2021-02-23 ENCOUNTER — Ambulatory Visit: Payer: Self-pay

## 2021-02-23 NOTE — Lactation Note (Signed)
This note was copied from a baby's chart. Lactation Consultation Note LC to room to assist with breastfeeding. Parents decided to bottle feed at this feeding. Mother pumped 5x yesterday. Reviewed the need for frequent breast stimulation to avoid engorgement and to ensure good supply.  Patient Name: Laura Mayo GOTLX'B Date: 02/23/2021 Reason for consult: NICU baby;Follow-up assessment Age:20 days  Maternal Data  pumped 5x yesterday with 1-2 oz per breast yield. Mother denies engorgement today.   Feeding Mother's Current Feeding Choice: Breast Milk and Donor Milk Nipple Type: Nfant Extra Slow Flow (gold)  Interventions Interventions: Education  Discharge Discharge Education: Engorgement and breast care  Consult Status Consult Status: Follow-up Follow-up type: In-patient   Elder Negus, MA IBCLC 02/23/2021, 11:58 AM

## 2021-02-24 ENCOUNTER — Other Ambulatory Visit: Payer: Medicaid Other

## 2021-02-25 ENCOUNTER — Ambulatory Visit: Payer: Self-pay

## 2021-02-25 NOTE — Lactation Note (Signed)
This note was copied from a baby's chart. Lactation Consultation Note  Patient Name: Boy Arneda Sappington HQION'G Date: 02/25/2021 Reason for consult: Follow-up assessment;NICU baby;Early term 37-38.6wks Age:20 days  Follow up visit to P1 mother of 28 days old infant. Mother just finished pumping and collected ~13mL of EBM. Mother pumps every 3h for 30 minutes during daytime and 15 minutes during night time.     Reinforced pumping with her letdown in addition to two more minutes when noticing drops after letdown. Discussed that pumping session time will always vary and it will give her stimulation similar to infant feeding from breast. Mother verbalizes in agreement.   Encouraged to request [facility] for any needs, support or questions. Praised mother for her milk supply, effort and dedication. Father is present at the time of consult.    Feeding Mother's Current Feeding Choice: Breast Milk and Donor Milk Nipple Type: Dr. Levert Feinstein Preemie  Lactation Tools Discussed/Used Tools: Pump Breast pump type: Double-Electric Breast Pump Reason for Pumping: stimulation and supplementation; NICU baby Pumping frequency: Q3 Pumped volume: 75 mL  Interventions Interventions: Education;DEBP;Expressed milk;Breast feeding basics reviewed  Consult Status Consult Status: Follow-up Date: 02/26/21 Follow-up type: In-patient    Megin Consalvo A Higuera Ancidey 02/25/2021, 4:11 PM

## 2021-02-26 ENCOUNTER — Encounter: Payer: Self-pay | Admitting: *Deleted

## 2021-02-27 ENCOUNTER — Other Ambulatory Visit: Payer: Medicaid Other

## 2021-02-27 ENCOUNTER — Encounter: Payer: Medicaid Other | Admitting: Obstetrics & Gynecology

## 2021-03-03 ENCOUNTER — Other Ambulatory Visit: Payer: Medicaid Other

## 2021-03-05 ENCOUNTER — Inpatient Hospital Stay (HOSPITAL_COMMUNITY): Admit: 2021-03-05 | Payer: Medicaid Other | Admitting: Family Medicine

## 2021-03-26 ENCOUNTER — Ambulatory Visit: Payer: Medicaid Other | Admitting: Women's Health

## 2022-07-06 ENCOUNTER — Other Ambulatory Visit: Payer: Self-pay

## 2022-07-06 ENCOUNTER — Emergency Department (HOSPITAL_COMMUNITY)
Admission: EM | Admit: 2022-07-06 | Discharge: 2022-07-06 | Disposition: A | Discharge status: Home / Self Care | Payer: Medicaid Other | Attending: Emergency Medicine | Admitting: Emergency Medicine

## 2022-07-06 ENCOUNTER — Encounter (HOSPITAL_COMMUNITY): Payer: Self-pay | Admitting: Emergency Medicine

## 2022-07-06 DIAGNOSIS — K0889 Other specified disorders of teeth and supporting structures: Secondary | ICD-10-CM | POA: Diagnosis present

## 2022-07-06 DIAGNOSIS — K047 Periapical abscess without sinus: Secondary | ICD-10-CM | POA: Insufficient documentation

## 2022-07-06 MED ORDER — BENZOCAINE 20 % MT AERO
INHALATION_SPRAY | Freq: Once | OROMUCOSAL | Status: AC
  Filled 2022-07-06: qty 57

## 2022-07-06 MED ORDER — PENICILLIN V POTASSIUM 250 MG PO TABS
500.0000 mg | ORAL_TABLET | Freq: Once | ORAL | Status: AC
  Administered 2022-07-06: 500 mg via ORAL
  Filled 2022-07-06: qty 2

## 2022-07-06 MED ORDER — PENICILLIN V POTASSIUM 500 MG PO TABS: 500.0000 mg | ORAL_TABLET | Freq: Three times a day (TID) | ORAL | 0 refills | Status: DC

## 2022-07-06 MED ORDER — DICLOFENAC SODIUM 75 MG PO TBEC: 75.0000 mg | DELAYED_RELEASE_TABLET | Freq: Two times a day (BID) | ORAL | 0 refills | Status: DC

## 2022-07-06 NOTE — ED Triage Notes (Signed)
Pt to the ED with complaints of dental pain with swelling to the left upper jaw for the past week.

## 2022-07-06 NOTE — ED Provider Notes (Signed)
[facility] EMERGENCY DEPARTMENT Provider Note   CSN: [id number] Arrival date & time: 07/06/22  1414     History  Chief Complaint  Patient presents with   Dental Pain    Laura Mayo is a 21 y.o. female.   Dental Pain Associated symptoms: no facial swelling, no fever and no headaches         Laura Mayo is a 21 y.o. female who presents to the Emergency Department complaining of left upper dental pain for 1 week.  She states that she has a decayed tooth to that area and underwent a partial root canal that was not completed sometime ago.  She noticed some increasing pain to her left upper tooth that is radiating toward her ear.  Pain is associated with exposure to air, cold food or liquids and with chewing.  She denies any neck pain, difficulty swallowing, fever or chills.  No significant swelling of her face.  She is currently on a cancellation list for her dentist.   Home Medications Prior to Admission medications   Medication Sig Start Date End Date Taking? Authorizing Provider  ibuprofen (ADVIL) 600 MG tablet Take 1 tablet (600 mg total) by mouth every 6 (six) hours. 02/21/21   Maness, Loistine Chance, MD  Prenatal Vit-Fe Fumarate-FA (PRENATAL VITAMIN PO) Take by mouth.    [provider]      Allergies    Patient has no known allergies.    Review of Systems   Review of Systems  Constitutional:  Negative for chills and fever.  HENT:  Positive for dental problem. Negative for ear pain, facial swelling, sore throat and trouble swallowing.   Respiratory:  Negative for cough and shortness of breath.   Cardiovascular:  Negative for chest pain.  Gastrointestinal:  Negative for nausea and vomiting.  Neurological:  Negative for dizziness, weakness and headaches.    Physical Exam Updated Vital Signs BP (!) 144/102 (BP Location: Left Arm)   Pulse 68   Temp 98.8 F (37.1 C) (Oral)   Resp 18   Ht 5\' 5"  (1.651 m)   Wt 61.2 kg   SpO2 94%   BMI 22.47 kg/m   Physical Exam Vitals and nursing note reviewed.  Constitutional:      General: She is not in acute distress.    Appearance: Normal appearance.  HENT:     Mouth/Throat:     Mouth: Mucous membranes are moist.     Dentition: Dental tenderness, gingival swelling and dental caries present.     Pharynx: Oropharynx is clear. Uvula midline. No uvula swelling.     Comments: Multiple dental caries, there is tenderness to palpation of the left upper second molar.  Partially decayed tooth.  There is a single pustule noted to the lateral aspect of the gingiva.  No facial edema.  No trismus, uvula midline and nonedematous. Eyes:     Conjunctiva/sclera: Conjunctivae normal.  Cardiovascular:     Rate and Rhythm: Normal rate and regular rhythm.     Pulses: Normal pulses.  Pulmonary:     Effort: Pulmonary effort is normal.  Musculoskeletal:        General: Normal range of motion.     Cervical back: Normal range of motion. No tenderness.  Lymphadenopathy:     Cervical: No cervical adenopathy.  Skin:    General: Skin is warm.     Capillary Refill: Capillary refill takes less than 2 seconds.  Neurological:     General: No focal deficit present.  Mental Status: She is alert.     Sensory: No sensory deficit.     Motor: No weakness.     ED Results / Procedures / Treatments   Labs (all labs ordered are listed, but only abnormal results are displayed) Labs Reviewed - No data to display  EKG None  Radiology No results found.  Procedures Procedures    INCISION AND DRAINAGE Performed by: Jailynne Opperman Consent: Verbal consent obtained. Risks and benefits: risks, benefits and alternatives were discussed Type: abscess  Body area: mouth  Anesthesia: topical infiltration  Incision was made with a #11 scalpel.  Topical anesthetic spray of 20% benzocaine  Anesthetic total:   Complexity: simple  Simple stab incision to pustule of the left upper gingiva  Drainage:  purulent  Drainage amount: Small  Packing material: none  Patient tolerance: Patient tolerated the procedure well with no immediate complications.    Medications Ordered in ED Medications  penicillin v potassium (VEETID) tablet 500 mg (has no administration in time range)  Benzocaine (HURRCAINE) 20 % mouth spray ( Mouth/Throat Given 07/06/22 1620)    ED Course/ Medical Decision Making/ A&P                           Medical Decision Making Patient here for evaluation of dental pain.  Symptoms began 1 week ago.  She noticed increasing pain to her left face that is radiating toward her ear.  She noticed a pustule to the gums near the second molar.  She denies any difficulty swallowing, neck pain or trismus.  On exam, patient well-appearing nontoxic.  Vital signs reassuring.  She is hypertensive on arrival but improved.  There is localized tenderness of the left upper second molar and the tooth is partially decayed.  Small pustule present to the adjacent gingiva.  No facial swelling, trismus or other symptoms suggestive of Ludewig's angina.  Differential diagnosis would include but not limited to Ludewig's angina.  Dental pain, dental abscess, otitis media  Risk Prescription drug management.           Final Clinical Impression(s) / ED Diagnoses Final diagnoses:  Dental abscess    Rx / DC Orders ED Discharge Orders     None         Kem Parkinson, PA-C 07/06/22 1701    Noemi Chapel, MD 07/07/22 2231

## 2022-07-06 NOTE — Discharge Instructions (Signed)
Warm salt water rinses several times a day.  Soft foods and liquid diet.  Take the antibiotic as directed.  Be sure to follow-up with your dentist soon.

## 2022-12-17 ENCOUNTER — Encounter: Payer: Self-pay | Admitting: Radiology

## 2023-05-04 ENCOUNTER — Encounter: Payer: Medicaid Other | Admitting: Adult Health

## 2024-01-20 ENCOUNTER — Ambulatory Visit: Admitting: Advanced Practice Midwife

## 2024-01-20 ENCOUNTER — Encounter: Payer: Self-pay | Admitting: Advanced Practice Midwife

## 2024-01-20 VITALS — BP 122/83 | HR 128

## 2024-01-20 DIAGNOSIS — Z30011 Encounter for initial prescription of contraceptive pills: Secondary | ICD-10-CM

## 2024-01-20 DIAGNOSIS — Z3046 Encounter for surveillance of implantable subdermal contraceptive: Secondary | ICD-10-CM

## 2024-01-20 MED ORDER — NORETHIN-ETH ESTRAD-FE BIPHAS 1 MG-10 MCG / 10 MCG PO TABS: 1.0000 | ORAL_TABLET | Freq: Every day | ORAL | 4 refills | Status: AC

## 2024-01-20 NOTE — Progress Notes (Signed)
 HPI:  Laura Mayo 23 y.o. here for Nexplanon removal.  Her future plans for birth control are COCs.  Past Medical History: Past Medical History:  Diagnosis Date   Medical history non-contributory    Pregnancy induced hypertension     Past Surgical History: Past Surgical History:  Procedure Laterality Date   NO PAST SURGERIES      Family History: Family History  Problem Relation Age of Onset   Cancer Paternal Grandmother        breast   Cancer Maternal Grandmother        colon   Hypertension Maternal Grandfather     Social History: Social History   Tobacco Use   Smoking status: Former    Current packs/day: 0.50    Types: Cigarettes   Smokeless tobacco: Never  Vaping Use   Vaping status: Never Used  Substance Use Topics   Alcohol use: No   Drug use: No    Allergies: No Known Allergies  Meds: (Not in a hospital admission)     Patient given informed consent for removal of her Nexplanon, time out was performed.  Signed copy in the chart.  Appropriate time out taken. Implanon site identified.  Area prepped in usual sterile fashon. One cc of 1% lidocaine was used to anesthetize the area at the distal end of the implant. A small stab incision was made right beside the implant on the distal portion.  The Nexplanon rod was grasped using hemostats and removed without difficulty.  There was less than 3 cc blood loss. There were no complications.  A small amount of antibiotic ointment and steri-strips were applied over the small incision.  A pressure bandage was applied to reduce any bruising.  The patient tolerated the procedure well and was given post procedure instructions.

## 2024-12-25 ENCOUNTER — Ambulatory Visit: Admitting: Family Medicine
